# Patient Record
Sex: Female | Born: 1985
Health system: Southern US, Community
[De-identification: ages and names within clinical notes are randomized; demographics above are authoritative.]

## PROBLEM LIST (undated history)

## (undated) DIAGNOSIS — D241 Benign neoplasm of right breast: Secondary | ICD-10-CM

## (undated) DIAGNOSIS — K5900 Constipation, unspecified: Secondary | ICD-10-CM

## (undated) DIAGNOSIS — E039 Hypothyroidism, unspecified: Secondary | ICD-10-CM

## (undated) HISTORY — DX: Hypothyroidism, unspecified: E03.9

## (undated) HISTORY — DX: Benign neoplasm of right breast: D24.1

## (undated) HISTORY — DX: Constipation, unspecified: K59.00

---
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
## 2009-09-28 HISTORY — PX: WISDOM TOOTH EXTRACTION: SHX21

## 2012-01-24 ENCOUNTER — Emergency Department: Payer: Self-pay | Admitting: *Deleted

## 2012-04-22 ENCOUNTER — Emergency Department (HOSPITAL_COMMUNITY): Payer: BC Managed Care – PPO

## 2012-04-22 ENCOUNTER — Emergency Department (HOSPITAL_COMMUNITY)
Admission: EM | Admit: 2012-04-22 | Discharge: 2012-04-23 | Disposition: A | Discharge status: Home / Self Care | Payer: BC Managed Care – PPO | Attending: Emergency Medicine | Admitting: Emergency Medicine

## 2012-04-22 ENCOUNTER — Encounter (HOSPITAL_COMMUNITY): Payer: Self-pay | Admitting: Emergency Medicine

## 2012-04-22 DIAGNOSIS — W540XXA Bitten by dog, initial encounter: Secondary | ICD-10-CM | POA: Insufficient documentation

## 2012-04-22 DIAGNOSIS — S61409A Unspecified open wound of unspecified hand, initial encounter: Secondary | ICD-10-CM | POA: Insufficient documentation

## 2012-04-22 MED ORDER — ONDANSETRON 4 MG PO TBDP
4.0000 mg | ORAL_TABLET | Freq: Once | ORAL | Status: AC
  Administered 2012-04-22: 4 mg via ORAL
  Filled 2012-04-22: qty 1

## 2012-04-22 MED ORDER — OXYCODONE-ACETAMINOPHEN 5-325 MG PO TABS
1.0000 | ORAL_TABLET | Freq: Once | ORAL | Status: AC
  Administered 2012-04-22: 1 via ORAL
  Filled 2012-04-22: qty 1

## 2012-04-22 MED ORDER — OXYCODONE-ACETAMINOPHEN 5-325 MG PO TABS: 1.0000 | ORAL_TABLET | Freq: Four times a day (QID) | ORAL | Status: AC | PRN

## 2012-04-22 MED ORDER — ONDANSETRON HCL 4 MG PO TABS: 4.0000 mg | ORAL_TABLET | Freq: Four times a day (QID) | ORAL | Status: AC

## 2012-04-22 MED ORDER — AMOXICILLIN-POT CLAVULANATE 500-125 MG PO TABS: 1.0000 | ORAL_TABLET | Freq: Three times a day (TID) | ORAL | Status: AC

## 2012-04-22 MED ORDER — AMOXICILLIN-POT CLAVULANATE 875-125 MG PO TABS
1.0000 | ORAL_TABLET | Freq: Once | ORAL | Status: AC
  Administered 2012-04-22: 1 via ORAL
  Filled 2012-04-22: qty 1

## 2012-04-22 NOTE — ED Provider Notes (Signed)
History     CSN: [id number]  Arrival date & time 04/22/12  2113   First MD Initiated Contact with Patient 04/22/12 2218      Chief Complaint  Patient presents with  . Animal Bite    (Consider location/radiation/quality/duration/timing/severity/associated sxs/prior treatment) HPI  He should brought to the ER by her mom with complaints of laceration to the fourth finger on the right hand. Her 2 dogs got into a fight and she tried to break it out. She states that both of her shots are up-to-date on rabies vaccination and she had her last tetanus shot in 2011. She has sustained a fracture to her right fourth finger PIP joint. She denies any other injuries. She states that she can move her hands full range of motion and without much pain. The incident happened one hour ago prior to arrival. NAD/VSS  History reviewed. No pertinent past medical history.  History reviewed. No pertinent past surgical history.  No family history on file.  History  Substance Use Topics  . Smoking status: Not on file  . Smokeless tobacco: Not on file  . Alcohol Use: Not on file    OB History    Grav Para Term Preterm Abortions TAB SAB Ect Mult Living                  Review of Systems   HEENT: denies blurry vision or change in hearing PULMONARY: Denies difficulty breathing and SOB CARDIAC: denies chest pain or heart palpitations MUSCULOSKELETAL:  denies being unable to ambulate ABDOMEN AL: denies abdominal pain GU: denies loss of bowel or urinary control NEURO: denies numbness and tingling in extremities SKIN: no new rashes PSYCH: patient denies anxiety or depression. NECK: Pt denies having neck pain     Allergies  Review of patient's allergies indicates no known allergies.  Home Medications   Current Outpatient Rx  Name Route Sig Dispense Refill  . AMOXICILLIN-POT CLAVULANATE 500-125 MG PO TABS Oral Take 1 tablet (500 mg total) by mouth every 8 (eight) hours. 21 tablet 0  .  ONDANSETRON HCL 4 MG PO TABS Oral Take 1 tablet (4 mg total) by mouth every 6 (six) hours. 12 tablet 0  . OXYCODONE-ACETAMINOPHEN 5-325 MG PO TABS Oral Take 1 tablet by mouth every 6 (six) hours as needed for pain. 15 tablet 0    BP 95/50  Pulse 59  Temp 99 F (37.2 C) (Oral)  Resp 17  SpO2 100%  LMP 04/02/2012  Physical Exam  Nursing note and vitals reviewed. Constitutional: She appears well-developed and well-nourished. No distress.  HENT:  Head: Normocephalic and atraumatic.  Eyes: Pupils are equal, round, and reactive to light.  Neck: Normal range of motion. Neck supple.  Cardiovascular: Normal rate and regular rhythm.   Pulmonary/Chest: Effort normal.  Abdominal: Soft.  Musculoskeletal:       Right hand: She exhibits tenderness and laceration (2cm lac as described  in laceration repair). She exhibits normal range of motion, no bony tenderness, normal capillary refill, no deformity and no swelling. normal sensation noted. Normal strength noted.       Hands: Neurological: She is alert.  Skin: Skin is warm and dry.    ED Course  Procedures (including critical care time)  Labs Reviewed - No data to display Dg Hand Complete Right  04/22/2012  *RADIOLOGY REPORT*  Clinical Data: Animal bite  RIGHT HAND - COMPLETE 3+ VIEW  Comparison: None.  Findings: No acute fracture and no dislocation.  Unremarkable  soft tissues.  IMPRESSION: No acute bony pathology.  Original Report Authenticated By: Donavan Burnet, M.D.     1. Dog bite of hand       MDM   Patients wound is gapping, therefore two loose stitches applied. Pt  UTD on tetanus, both dogs have rabies vaccination up to date. Pt has not deficits in her hand, neuro vascularity intact.  Wound thoroughly irrigated with of water. Discussed risk of infection despite irrigation and antibiotics.  LACERATION REPAIR Performed by: Dorthula Matas Authorized by: Dorthula Matas Consent: Verbal consent obtained. Risks and  benefits: risks, benefits and alternatives were discussed Consent given by: patient Patient identity confirmed: provided demographic data Prepped and Draped in normal sterile fashion Wound explored  Laceration Location: right hand over 4th PIP joint  Laceration Length: 2cm  No Foreign Bodies seen or palpated  Anesthesia: local infiltration  Local anesthetic: lidocaine 2% wo epinephrine  Anesthetic total: 3 ml  Irrigation method: syringe Amount of cleaning: standard  Skin closure: sutures  Number of sutures: 2  Technique: simple interrupted loose  Patient tolerance: Patient tolerated the procedure well with no immediate complications.    Pt started on Augmenting and given hand referral.  Pt has been advised of the symptoms that warrant their return to the ED. Patient has voiced understanding and has agreed to follow-up with the PCP or specialist.        Dorthula Matas, PA 04/22/12 2355

## 2012-04-22 NOTE — ED Provider Notes (Signed)
Medical screening examination/treatment/procedure(s) were performed by non-physician practitioner and as supervising physician I was immediately available for consultation/collaboration.  Kameko Hukill, MD 04/22/12 2359 

## 2012-04-22 NOTE — ED Notes (Signed)
Pt has lac on 4th finger on R hand. Pt was trying to break up a dog fight. Pt states that dogs were up to date on their shots. Pt is up to date on tetanus shot. Bleeding controlled.

## 2013-06-22 IMAGING — CR DG HAND COMPLETE 3+V*R*
3 series · 3 of 3 positions shown · non-contrast
Comparison: None.

CLINICAL DATA: Animal bite

RIGHT HAND - COMPLETE 3+ VIEW

[x hand pa right]
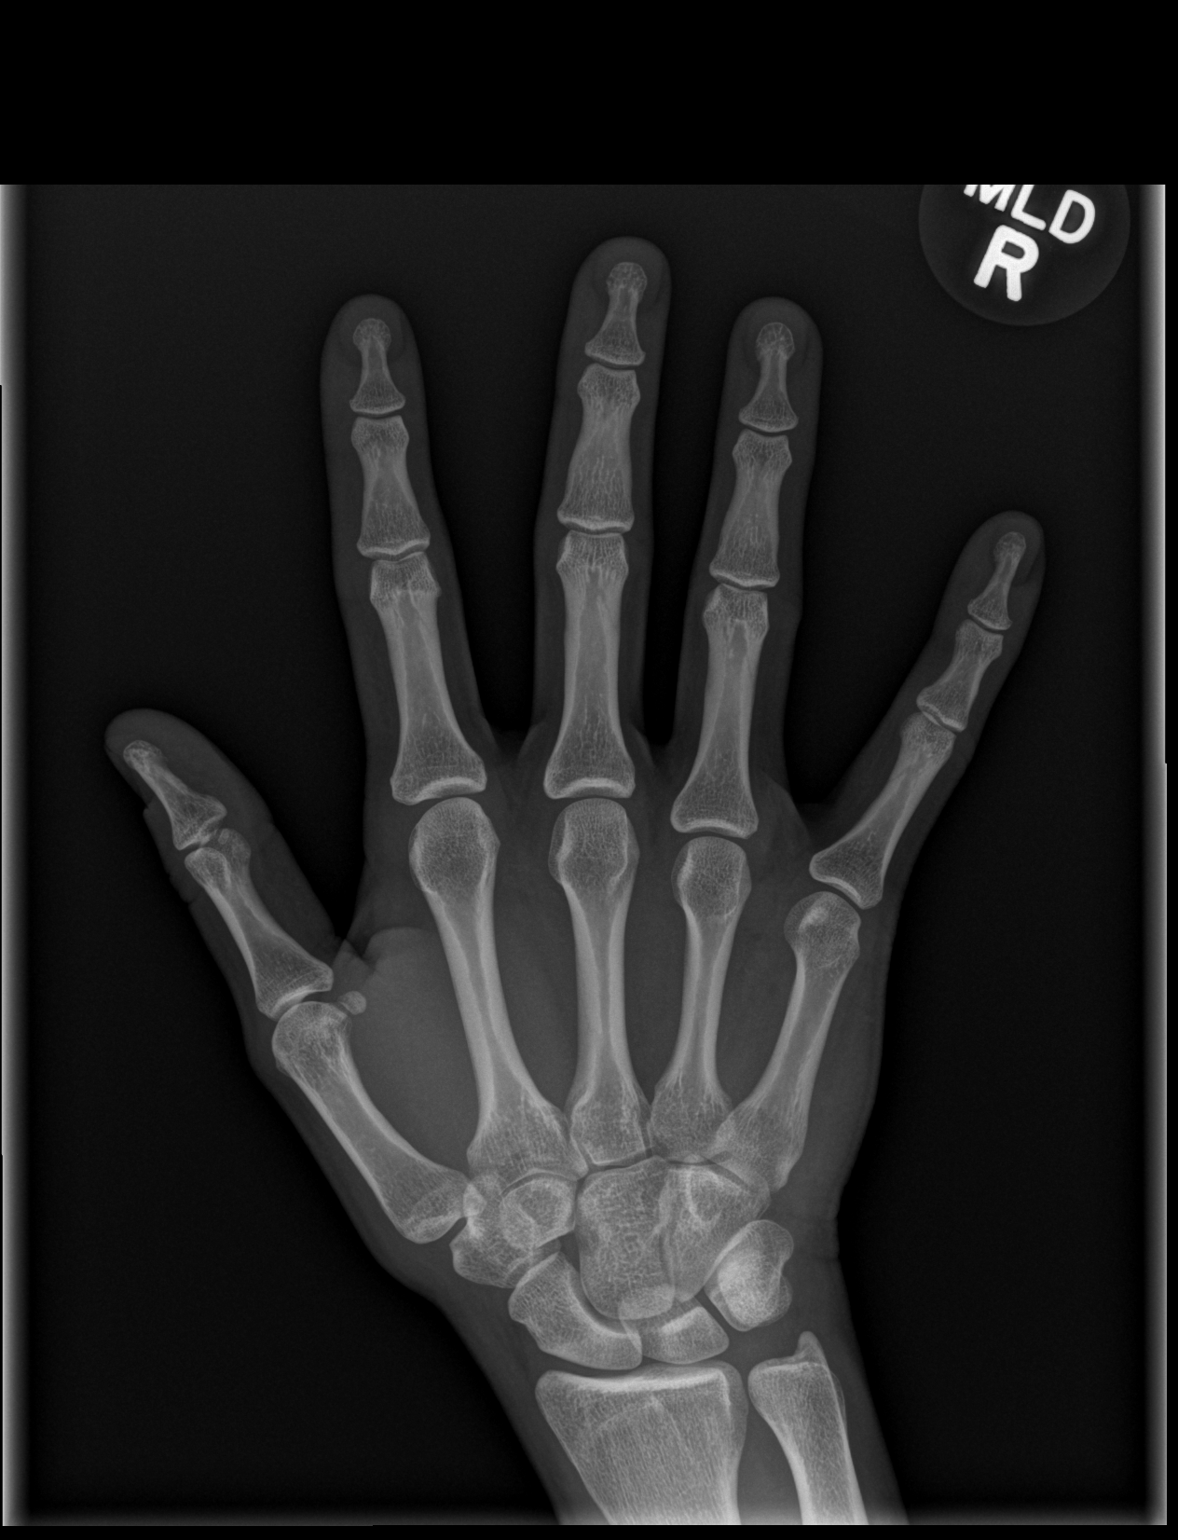

[x hand obl right]
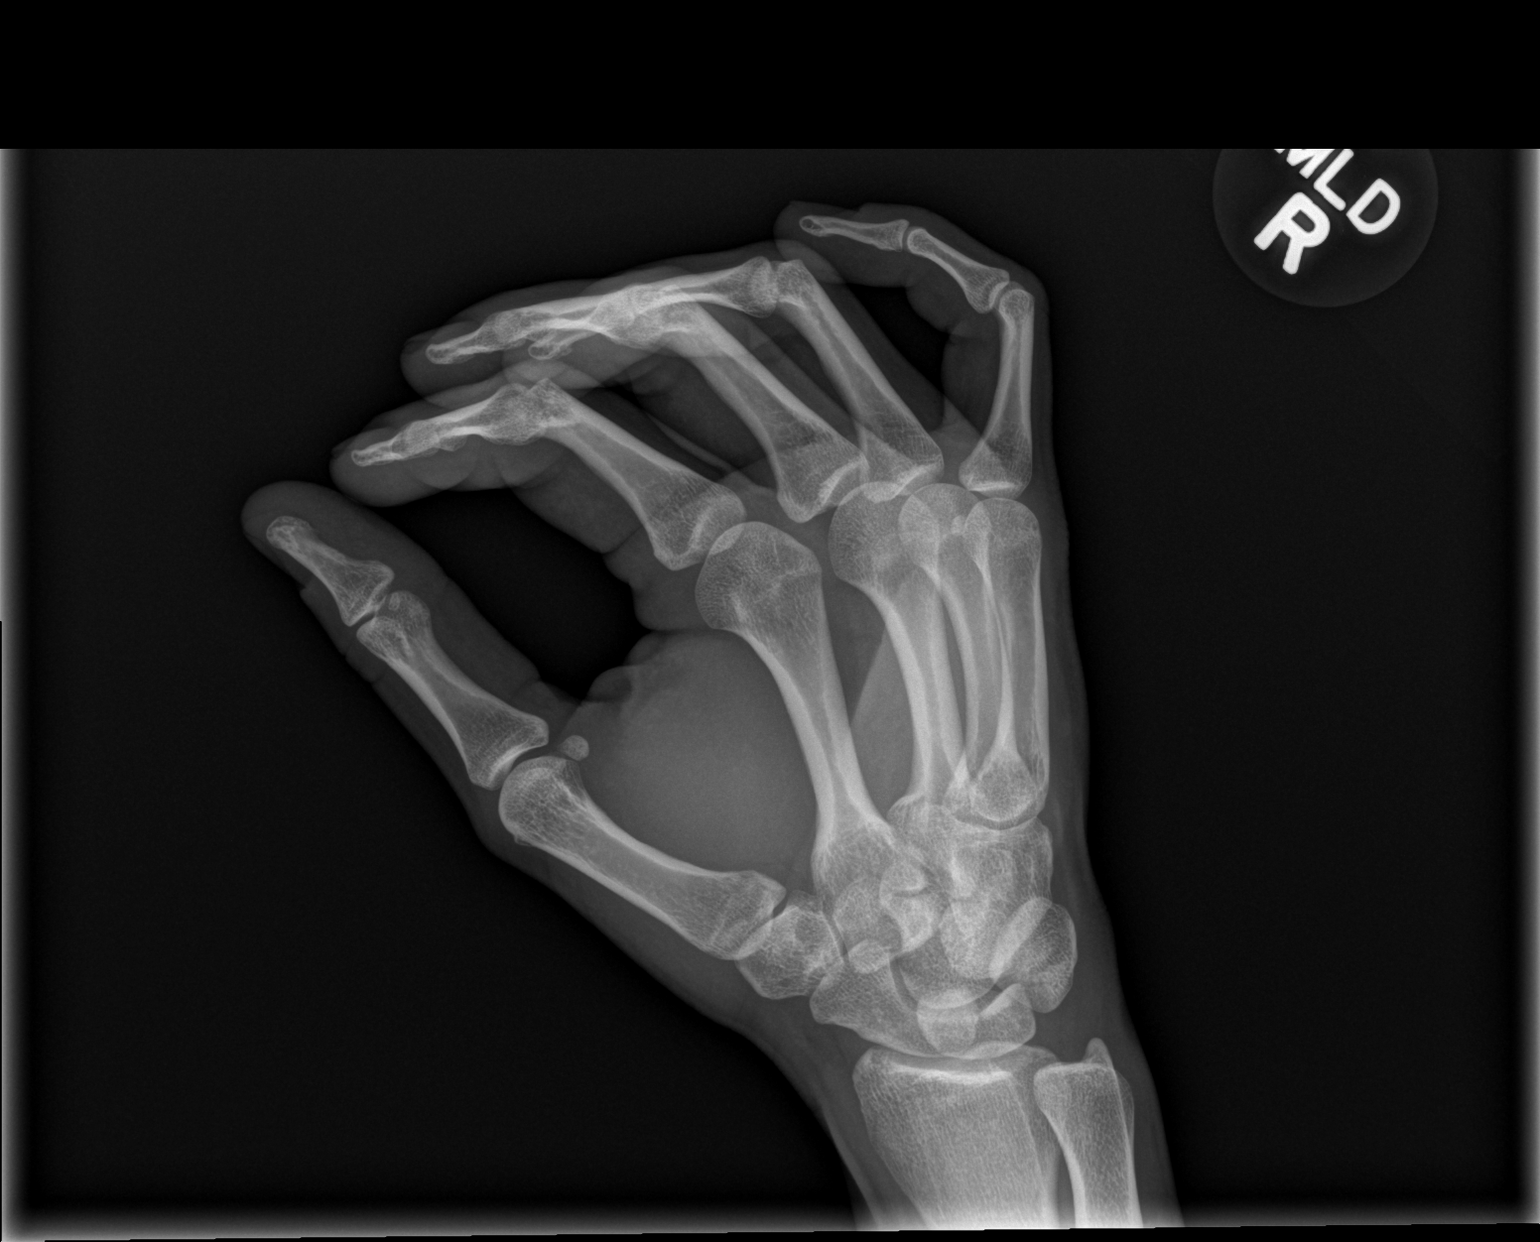

[x hand lat right]
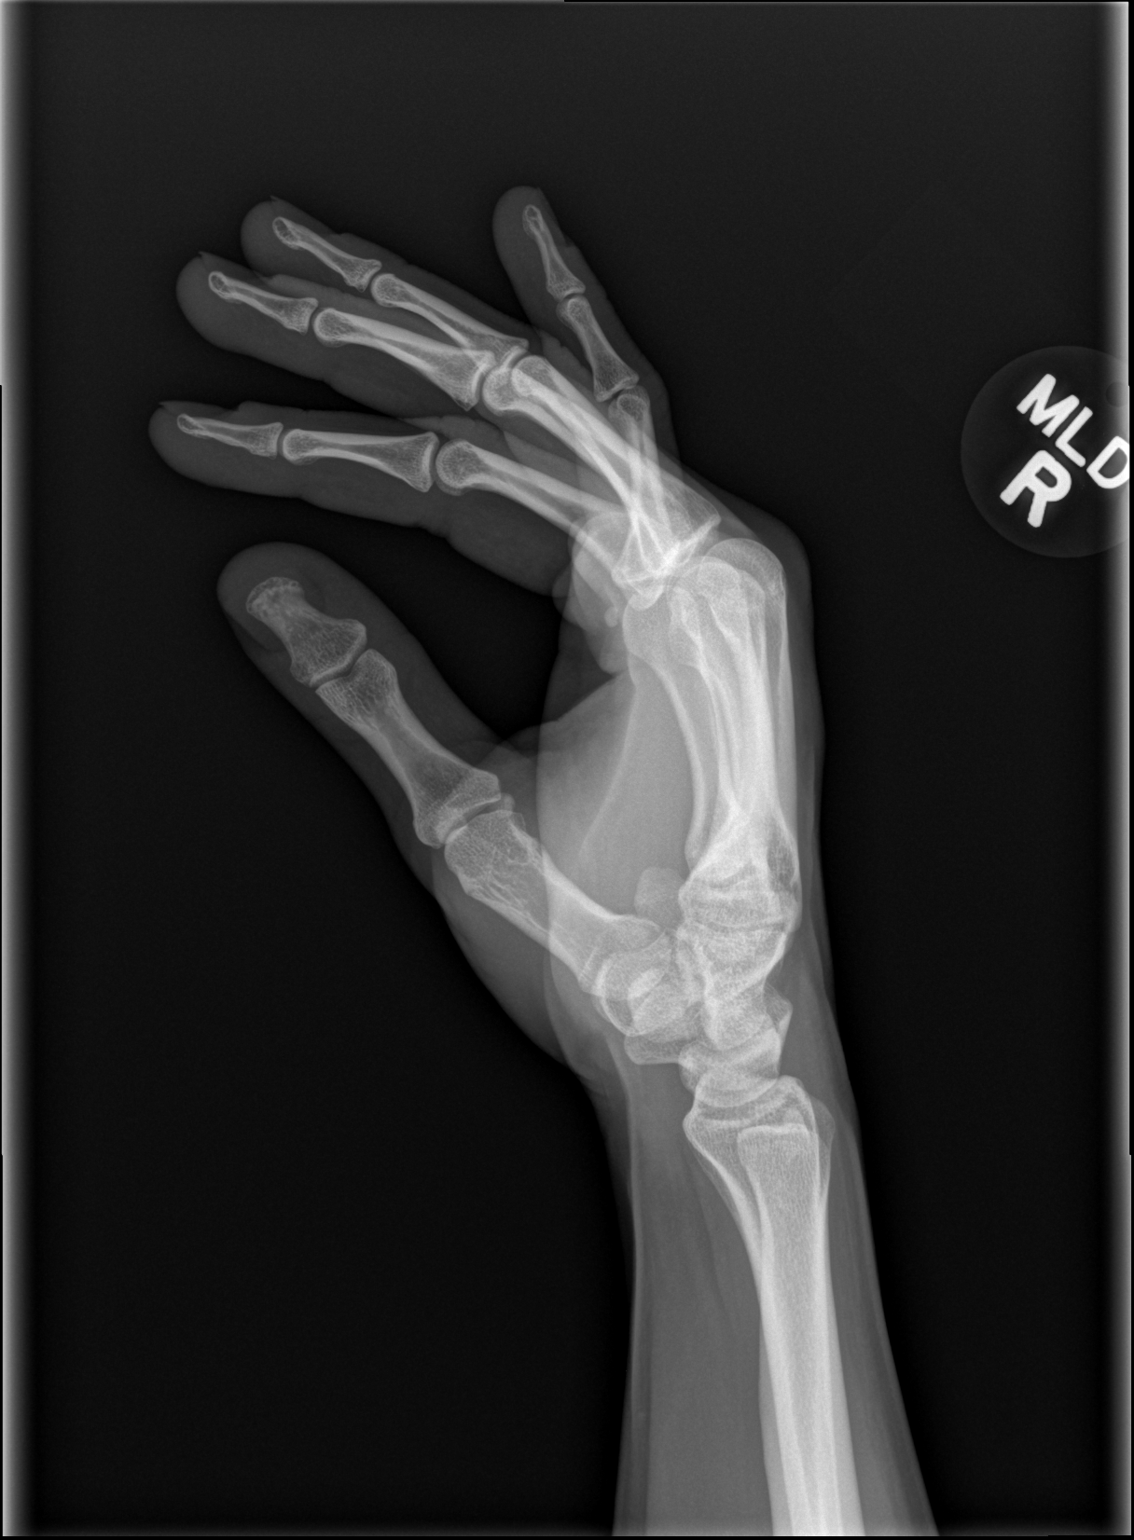

[3 of 3 positions shown; findings below may reference images not displayed]

FINDINGS: No acute fracture and no dislocation.  Unremarkable soft
tissues.
IMPRESSION: No acute bony pathology.

## 2014-03-13 ENCOUNTER — Ambulatory Visit: Payer: Self-pay | Admitting: Unknown Physician Specialty

## 2015-12-20 LAB — HM PAP SMEAR

## 2016-12-09 DIAGNOSIS — E039 Hypothyroidism, unspecified: Secondary | ICD-10-CM | POA: Diagnosis not present

## 2016-12-09 DIAGNOSIS — Z131 Encounter for screening for diabetes mellitus: Secondary | ICD-10-CM | POA: Diagnosis not present

## 2016-12-10 DIAGNOSIS — E039 Hypothyroidism, unspecified: Secondary | ICD-10-CM | POA: Diagnosis not present

## 2017-01-20 DIAGNOSIS — R300 Dysuria: Secondary | ICD-10-CM | POA: Diagnosis not present

## 2017-01-20 DIAGNOSIS — B373 Candidiasis of vulva and vagina: Secondary | ICD-10-CM | POA: Diagnosis not present

## 2017-10-01 DIAGNOSIS — K219 Gastro-esophageal reflux disease without esophagitis: Secondary | ICD-10-CM | POA: Diagnosis not present

## 2017-10-01 DIAGNOSIS — E039 Hypothyroidism, unspecified: Secondary | ICD-10-CM | POA: Diagnosis not present

## 2017-10-01 DIAGNOSIS — F411 Generalized anxiety disorder: Secondary | ICD-10-CM | POA: Diagnosis not present

## 2018-02-14 DIAGNOSIS — D2271 Melanocytic nevi of right lower limb, including hip: Secondary | ICD-10-CM | POA: Diagnosis not present

## 2018-02-14 DIAGNOSIS — D2272 Melanocytic nevi of left lower limb, including hip: Secondary | ICD-10-CM | POA: Diagnosis not present

## 2018-02-14 DIAGNOSIS — D2262 Melanocytic nevi of left upper limb, including shoulder: Secondary | ICD-10-CM | POA: Diagnosis not present

## 2018-02-14 DIAGNOSIS — D2261 Melanocytic nevi of right upper limb, including shoulder: Secondary | ICD-10-CM | POA: Diagnosis not present

## 2018-02-14 DIAGNOSIS — D485 Neoplasm of uncertain behavior of skin: Secondary | ICD-10-CM | POA: Diagnosis not present

## 2018-02-15 DIAGNOSIS — F411 Generalized anxiety disorder: Secondary | ICD-10-CM | POA: Diagnosis not present

## 2018-02-15 DIAGNOSIS — G4709 Other insomnia: Secondary | ICD-10-CM | POA: Diagnosis not present

## 2018-02-15 DIAGNOSIS — E039 Hypothyroidism, unspecified: Secondary | ICD-10-CM | POA: Diagnosis not present

## 2018-02-15 DIAGNOSIS — F988 Other specified behavioral and emotional disorders with onset usually occurring in childhood and adolescence: Secondary | ICD-10-CM | POA: Diagnosis not present

## 2018-05-18 DIAGNOSIS — E039 Hypothyroidism, unspecified: Secondary | ICD-10-CM | POA: Diagnosis not present

## 2018-05-18 DIAGNOSIS — R202 Paresthesia of skin: Secondary | ICD-10-CM | POA: Diagnosis not present

## 2018-05-18 DIAGNOSIS — F411 Generalized anxiety disorder: Secondary | ICD-10-CM | POA: Diagnosis not present

## 2018-06-16 DIAGNOSIS — D2272 Melanocytic nevi of left lower limb, including hip: Secondary | ICD-10-CM | POA: Diagnosis not present

## 2018-07-08 DIAGNOSIS — L237 Allergic contact dermatitis due to plants, except food: Secondary | ICD-10-CM | POA: Diagnosis not present

## 2018-07-08 DIAGNOSIS — R350 Frequency of micturition: Secondary | ICD-10-CM | POA: Diagnosis not present

## 2018-08-03 DIAGNOSIS — R202 Paresthesia of skin: Secondary | ICD-10-CM | POA: Diagnosis not present

## 2018-08-03 DIAGNOSIS — R399 Unspecified symptoms and signs involving the genitourinary system: Secondary | ICD-10-CM | POA: Diagnosis not present

## 2018-08-03 DIAGNOSIS — E039 Hypothyroidism, unspecified: Secondary | ICD-10-CM | POA: Diagnosis not present

## 2018-08-03 DIAGNOSIS — R350 Frequency of micturition: Secondary | ICD-10-CM | POA: Diagnosis not present

## 2018-08-04 DIAGNOSIS — N3000 Acute cystitis without hematuria: Secondary | ICD-10-CM | POA: Diagnosis not present

## 2018-08-11 DIAGNOSIS — N3000 Acute cystitis without hematuria: Secondary | ICD-10-CM | POA: Diagnosis not present

## 2018-10-17 ENCOUNTER — Encounter: Payer: Self-pay | Admitting: Obstetrics and Gynecology

## 2018-10-17 NOTE — Patient Instructions (Signed)
I value your feedback and entrusting us with your care. If you get a [facility] patient survey, I would appreciate you taking the time to let us know about your experience today. Thank you! 

## 2018-10-17 NOTE — Progress Notes (Signed)
PCP:  Patient, No Pcp Per   Chief Complaint  Patient presents with  . Gynecologic Exam     HPI:      [name] is a 33 y.o. No obstetric history on file. who LMP was Patient's last menstrual period was 10/02/2018 (approximate)., presents today for her annual examination.  Her menses are regular every 28-30 days, lasting 4 days.  Dysmenorrhea none. She does not have intermenstrual bleeding.  Sex activity: single partner, contraception - none. Conception ok. Taking vitamins.  Last Pap: December 20, 2015  Results were: no abnormalities  Hx of STDs: none  There is a FH of breast cancer in her PGGM, genetic testing not indicated. There is no FH of ovarian cancer. The patient does do self-breast exams. Hx of RT breast fibroadenoma in 2006, followed by Dr. Bary Castilla then. Pt has occas pain with fibroadenoma, usually before menses, but denies any change in size.   Tobacco use: The patient denies current or previous tobacco use. Alcohol use: social drinker Occas marijuana drug use.  Exercise: moderately active  She does get adequate calcium and Vitamin D in her diet. Labs with PCP, managing hypothyroidism.   Past Medical History:  Diagnosis Date  . Constipation   . Fibroadenoma of breast, right 2006   Dr. Bary Castilla  . Hypothyroidism     Past Surgical History:  Procedure Laterality Date  . WISDOM TOOTH EXTRACTION  2011    Family History  Problem Relation Age of Onset  . Hyperlipidemia Mother   . Other Mother   . Hypertension Father   . Hyperlipidemia Maternal Grandfather   . Testicular cancer Maternal Grandfather   . Prostate cancer Maternal Grandfather 60  . Other Other        thyroid issues  . Breast cancer Paternal Great-grandmother     Social History   Socioeconomic History  . Marital status: Single    Spouse name: Not on file  . Number of children: Not on file  . Years of education: Not on file  . Highest education level: Not on file  Occupational History    . Not on file  Social Needs  . Financial resource strain: Not on file  . Food insecurity:    Worry: Not on file    Inability: Not on file  . Transportation needs:    Medical: Not on file    Non-medical: Not on file  Tobacco Use  . Smoking status: Never Smoker  . Smokeless tobacco: Never Used  Substance and Sexual Activity  . Alcohol use: Yes  . Drug use: Yes    Types: Marijuana  . Sexual activity: Yes    Birth control/protection: None  Lifestyle  . Physical activity:    Days per week: Not on file    Minutes per session: Not on file  . Stress: Not on file  Relationships  . Social connections:    Talks on phone: Not on file    Gets together: Not on file    Attends religious service: Not on file    Active member of club or organization: Not on file    Attends meetings of clubs or organizations: Not on file    Relationship status: Not on file  . Intimate partner violence:    Fear of current or ex partner: Not on file    Emotionally abused: Not on file    Physically abused: Not on file    Forced sexual activity: Not on file  Other Topics  Concern  . Not on file  Social History Narrative  . Not on file    Outpatient Medications Prior to Visit  Medication Sig Dispense Refill  . levothyroxine (SYNTHROID, LEVOTHROID) 50 MCG tablet Take by mouth.     No facility-administered medications prior to visit.       ROS:  Review of Systems  Constitutional: Negative for fatigue, fever and unexpected weight change.  Respiratory: Negative for cough, shortness of breath and wheezing.   Cardiovascular: Negative for chest pain, palpitations and leg swelling.  Gastrointestinal: Negative for blood in stool, constipation, diarrhea, nausea and vomiting.  Endocrine: Negative for cold intolerance, heat intolerance and polyuria.  Genitourinary: Negative for dyspareunia, dysuria, flank pain, frequency, genital sores, hematuria, menstrual problem, pelvic pain, urgency, vaginal bleeding,  vaginal discharge and vaginal pain.  Musculoskeletal: Negative for back pain, joint swelling and myalgias.  Skin: Negative for rash.  Neurological: Negative for dizziness, syncope, light-headedness, numbness and headaches.  Hematological: Negative for adenopathy.  Psychiatric/Behavioral: Negative for agitation, confusion, sleep disturbance and suicidal ideas. The patient is not nervous/anxious.    BREAST: pos for tenderness/mass   Objective: BP 104/64   Pulse 92   Ht 5\' 5"  (1.651 m)   Wt 142 lb (64.4 kg)   LMP 10/02/2018 (Approximate)   BMI 23.63 kg/m    Physical Exam Constitutional:      Appearance: She is well-developed.  Genitourinary:     Vulva, vagina, cervix, uterus, right adnexa and left adnexa normal.     No vulval lesion or tenderness noted.     No vaginal discharge, erythema or tenderness.     No cervical polyp.     Uterus is not enlarged or tender.     No right or left adnexal mass present.     Right adnexa not tender.     Left adnexa not tender.  Neck:     Musculoskeletal: Normal range of motion.     Thyroid: No thyromegaly.  Cardiovascular:     Rate and Rhythm: Normal rate and regular rhythm.     Heart sounds: Normal heart sounds. No murmur.  Pulmonary:     Effort: Pulmonary effort is normal.     Breath sounds: Normal breath sounds.  Chest:     Breasts:        Right: Mass present. No nipple discharge, skin change or tenderness.        Left: No mass, nipple discharge, skin change or tenderness.       Comments: RT BREAST 9:30 POS WITH 1 CM FIRM, MOBILE, NT MASS Abdominal:     Palpations: Abdomen is soft.     Tenderness: There is no abdominal tenderness. There is no guarding.  Musculoskeletal: Normal range of motion.  Neurological:     Mental Status: She is alert and oriented to person, place, and time.     Cranial Nerves: No cranial nerve deficit.  Psychiatric:        Behavior: Behavior normal.  Vitals signs reviewed.      Assessment/Plan: Encounter for annual routine gynecological examination  Cervical cancer screening - Plan: Cytology - PAP  Screening for HPV (human papillomavirus) - Plan: Cytology - PAP  Fibroadenoma of breast, right - No change in size. Discussed repeat imaging if any change in size/pain/sx. Pt to f/u prn for now.          GYN counsel breast self exam, adequate intake of calcium and vitamin D, diet and exercise     F/U  Return  in about 1 year (around 10/19/2019).  Alicia B. Copland, PA-C 10/18/2018 2:51 PM

## 2018-10-18 ENCOUNTER — Other Ambulatory Visit (HOSPITAL_COMMUNITY)
Admission: RE | Admit: 2018-10-18 | Discharge: 2018-10-18 | Disposition: A | Discharge status: Home / Self Care | Payer: BLUE CROSS/BLUE SHIELD | Source: Ambulatory Visit | Attending: Obstetrics and Gynecology | Admitting: Obstetrics and Gynecology

## 2018-10-18 ENCOUNTER — Encounter: Payer: Self-pay | Admitting: Obstetrics and Gynecology

## 2018-10-18 ENCOUNTER — Ambulatory Visit (INDEPENDENT_AMBULATORY_CARE_PROVIDER_SITE_OTHER): Payer: BLUE CROSS/BLUE SHIELD | Admitting: Obstetrics and Gynecology

## 2018-10-18 VITALS — BP 104/64 | HR 92 | Ht 65.0 in | Wt 142.0 lb

## 2018-10-18 DIAGNOSIS — Z01419 Encounter for gynecological examination (general) (routine) without abnormal findings: Secondary | ICD-10-CM

## 2018-10-18 DIAGNOSIS — Z1151 Encounter for screening for human papillomavirus (HPV): Secondary | ICD-10-CM | POA: Diagnosis not present

## 2018-10-18 DIAGNOSIS — Z124 Encounter for screening for malignant neoplasm of cervix: Secondary | ICD-10-CM | POA: Insufficient documentation

## 2018-10-18 DIAGNOSIS — D241 Benign neoplasm of right breast: Secondary | ICD-10-CM

## 2018-10-21 LAB — CYTOLOGY - PAP
Diagnosis: NEGATIVE
HPV: NOT DETECTED

## 2018-10-26 DIAGNOSIS — M9903 Segmental and somatic dysfunction of lumbar region: Secondary | ICD-10-CM | POA: Diagnosis not present

## 2018-10-26 DIAGNOSIS — M9902 Segmental and somatic dysfunction of thoracic region: Secondary | ICD-10-CM | POA: Diagnosis not present

## 2018-10-26 DIAGNOSIS — M545 Low back pain: Secondary | ICD-10-CM | POA: Diagnosis not present

## 2018-10-26 DIAGNOSIS — M546 Pain in thoracic spine: Secondary | ICD-10-CM | POA: Diagnosis not present

## 2018-12-09 DIAGNOSIS — M7541 Impingement syndrome of right shoulder: Secondary | ICD-10-CM | POA: Diagnosis not present

## 2018-12-09 DIAGNOSIS — M25511 Pain in right shoulder: Secondary | ICD-10-CM | POA: Diagnosis not present

## 2018-12-16 DIAGNOSIS — E039 Hypothyroidism, unspecified: Secondary | ICD-10-CM | POA: Diagnosis not present

## 2018-12-26 DIAGNOSIS — M7551 Bursitis of right shoulder: Secondary | ICD-10-CM | POA: Diagnosis not present

## 2018-12-30 DIAGNOSIS — M7551 Bursitis of right shoulder: Secondary | ICD-10-CM | POA: Diagnosis not present

## 2018-12-30 DIAGNOSIS — M7541 Impingement syndrome of right shoulder: Secondary | ICD-10-CM | POA: Diagnosis not present

## 2018-12-30 DIAGNOSIS — M25512 Pain in left shoulder: Secondary | ICD-10-CM | POA: Diagnosis not present

## 2019-01-20 DIAGNOSIS — M7542 Impingement syndrome of left shoulder: Secondary | ICD-10-CM | POA: Diagnosis not present

## 2019-01-20 DIAGNOSIS — M7541 Impingement syndrome of right shoulder: Secondary | ICD-10-CM | POA: Diagnosis not present

## 2019-01-20 DIAGNOSIS — M25512 Pain in left shoulder: Secondary | ICD-10-CM | POA: Diagnosis not present

## 2019-01-20 DIAGNOSIS — M7551 Bursitis of right shoulder: Secondary | ICD-10-CM | POA: Diagnosis not present

## 2019-02-27 DIAGNOSIS — R1031 Right lower quadrant pain: Secondary | ICD-10-CM | POA: Diagnosis not present

## 2019-02-27 DIAGNOSIS — E039 Hypothyroidism, unspecified: Secondary | ICD-10-CM | POA: Diagnosis not present

## 2019-02-27 DIAGNOSIS — R350 Frequency of micturition: Secondary | ICD-10-CM | POA: Diagnosis not present

## 2021-09-09 ENCOUNTER — Ambulatory Visit (INDEPENDENT_AMBULATORY_CARE_PROVIDER_SITE_OTHER): Payer: Self-pay | Admitting: Obstetrics and Gynecology

## 2021-09-09 ENCOUNTER — Encounter: Payer: Self-pay | Admitting: Obstetrics and Gynecology

## 2021-09-09 ENCOUNTER — Other Ambulatory Visit: Payer: Self-pay

## 2021-09-09 VITALS — BP 90/70 | Ht 65.0 in | Wt 158.0 lb

## 2021-09-09 DIAGNOSIS — N6311 Unspecified lump in the right breast, upper outer quadrant: Secondary | ICD-10-CM | POA: Insufficient documentation

## 2021-09-09 DIAGNOSIS — N644 Mastodynia: Secondary | ICD-10-CM

## 2021-09-09 DIAGNOSIS — D241 Benign neoplasm of right breast: Secondary | ICD-10-CM | POA: Insufficient documentation

## 2021-09-09 NOTE — Progress Notes (Signed)
Patient, No Pcp Per (Inactive)   Chief Complaint  Patient presents with   Breast Exam    Lump in RB, irritated about twice a month, no change in size, tenderness in both breasts    HPI:      [name] is a 35 y.o. G0P0000 whose LMP was Patient's last menstrual period was 08/12/2021 (exact date)., presents today for RT breast pain/mass. Hx of RT breast fibroadenoma in 2006, followed by Dr. Bary Castilla then. Sx in same place, no change in size since first noted. Just more painful to pt now. Has dull pain 1-2 times a month (not necessarily menstrual related) with occas sharp pains. Would like it removed. Hasn't had recent f/u. FH breast cancer in her PGGM. Also with bilat breast tenderness at times without other masses. Does SBE. Drinks a lot of caffeine.  Current on pap, past due for annual, plans to sched.  Patient Active Problem List   Diagnosis Date Noted   Mass of upper outer quadrant of right breast 09/09/2021   Fibroadenoma of breast, right 09/09/2021   Past Surgical History:  Procedure Laterality Date   WISDOM TOOTH EXTRACTION  2011    Family History  Problem Relation Age of Onset   Hyperlipidemia Mother    Other Mother    Hypertension Father    Skin cancer Father    Skin cancer Sister    Hyperlipidemia Maternal Grandfather    Testicular cancer Maternal Grandfather    Prostate cancer Maternal Grandfather 37   Breast cancer Paternal Great-grandmother    Other Other        thyroid issues    Social History   Socioeconomic History   Marital status: Married    Spouse name: Not on file   Number of children: Not on file   Years of education: Not on file   Highest education level: Not on file  Occupational History   Not on file  Tobacco Use   Smoking status: Never   Smokeless tobacco: Never  Vaping Use   Vaping Use: Never used  Substance and Sexual Activity   Alcohol use: Yes   Drug use: Yes    Types: Marijuana   Sexual activity: Yes    Birth  control/protection: None  Other Topics Concern   Not on file  Social History Narrative   Not on file   Social Determinants of Health   Financial Resource Strain: Not on file  Food Insecurity: Not on file  Transportation Needs: Not on file  Physical Activity: Not on file  Stress: Not on file  Social Connections: Not on file  Intimate Partner Violence: Not on file    Outpatient Medications Prior to Visit  Medication Sig Dispense Refill   levothyroxine (SYNTHROID) 100 MCG tablet Take 100 mcg by mouth daily.     PARoxetine (PAXIL) 10 MG tablet Take by mouth.     SUMAtriptan (IMITREX) 50 MG tablet Take by mouth.     levothyroxine (SYNTHROID, LEVOTHROID) 50 MCG tablet Take by mouth.     No facility-administered medications prior to visit.      ROS:  Review of Systems  Constitutional:  Negative for fever.  Gastrointestinal:  Negative for blood in stool, constipation, diarrhea, nausea and vomiting.  Genitourinary:  Negative for dyspareunia, dysuria, flank pain, frequency, hematuria, urgency, vaginal bleeding, vaginal discharge and vaginal pain.  Musculoskeletal:  Negative for back pain.  Skin:  Negative for rash.  BREAST: No symptoms   OBJECTIVE:  Vitals:  BP 90/70    Ht 5\' 5"  (1.651 m)    Wt 158 lb (71.7 kg)    LMP 08/12/2021 (Exact Date)    BMI 26.29 kg/m   Physical Exam Vitals reviewed.  Pulmonary:     Effort: Pulmonary effort is normal.  Chest:  Breasts:    Breasts are symmetrical.     Right: Mass present. No inverted nipple, nipple discharge, skin change or tenderness.     Left: No inverted nipple, mass, nipple discharge, skin change or tenderness.    Musculoskeletal:        General: Normal range of motion.     Cervical back: Normal range of motion.  Skin:    General: Skin is warm and dry.  Neurological:     General: No focal deficit present.     Mental Status: She is alert and oriented to person, place, and time.     Cranial Nerves: No cranial nerve  deficit.  Psychiatric:        Mood and Affect: Mood normal.        Behavior: Behavior normal.        Thought Content: Thought content normal.        Judgment: Judgment normal.    Assessment/Plan: Fibroadenoma of breast, right - Plan: Ambulatory referral to General Surgery; refer back to Dr. Bary Castilla for mgmt. Will let him order dx mammo if he thinks necessary.   Breast tenderness--bilat with neg breast exam otherwise. D/C caffeine. F/u prn/at annual.     Return if symptoms worsen or fail to improve.  Maram Bently B. Dvid Pendry, PA-C 09/09/2021 9:32 AM

## 2021-10-02 ENCOUNTER — Other Ambulatory Visit: Payer: Self-pay

## 2021-10-02 ENCOUNTER — Ambulatory Visit: Payer: Self-pay | Admitting: Obstetrics and Gynecology

## 2021-10-29 ENCOUNTER — Ambulatory Visit: Payer: Self-pay | Admitting: Obstetrics and Gynecology

## 2021-10-29 NOTE — Progress Notes (Deleted)
PCP:  Patient, No Pcp Per (Inactive)   No chief complaint on file.    HPI:      [name] is a 36 y.o. No obstetric history on file. who LMP was No LMP recorded., presents today for her annual examination.  Her menses are regular every 28-30 days, lasting 4 days.  Dysmenorrhea none. She does not have intermenstrual bleeding.  Sex activity: single partner, contraception - none. Conception ok. Taking vitamins.  Last Pap: 10/18/18  Results were: no abnormalities /neg HPV DNA Hx of STDs: none  There is a FH of breast cancer in her PGGM, genetic testing not indicated. There is no FH of ovarian cancer. The patient does do self-breast exams. Hx of RT breast fibroadenoma in 2006, followed by Dr. Bary Castilla then. Pt has occas pain with fibroadenoma, usually before menses, but denies any change in size. Hx of RT breast fibroadenoma in 2006, followed by Dr. Bary Castilla then. Sx in same place, no change in size since first noted. Just more painful to pt now. Has dull pain 1-2 times a month (not necessarily menstrual related) with occas sharp pains. Would like it removed  Tobacco use: The patient denies current or previous tobacco use. Alcohol use: social drinker Occas marijuana drug use.  Exercise: moderately active  She does get adequate calcium and Vitamin D in her diet. Labs with PCP, managing hypothyroidism.   Past Medical History:  Diagnosis Date   Constipation    Fibroadenoma of breast, right 2006   Dr. Bary Castilla   Hypothyroidism     Past Surgical History:  Procedure Laterality Date   WISDOM TOOTH EXTRACTION  2011    Family History  Problem Relation Age of Onset   Hyperlipidemia Mother    Other Mother    Hypertension Father    Skin cancer Father    Skin cancer Sister    Hyperlipidemia Maternal Grandfather    Testicular cancer Maternal Grandfather    Prostate cancer Maternal Grandfather 61   Breast cancer Paternal Great-grandmother    Other Other        thyroid issues     Social History   Socioeconomic History   Marital status: Married    Spouse name: Not on file   Number of children: Not on file   Years of education: Not on file   Highest education level: Not on file  Occupational History   Not on file  Tobacco Use   Smoking status: Never   Smokeless tobacco: Never  Vaping Use   Vaping Use: Never used  Substance and Sexual Activity   Alcohol use: Yes   Drug use: Yes    Types: Marijuana   Sexual activity: Yes    Birth control/protection: None  Other Topics Concern   Not on file  Social History Narrative   Not on file   Social Determinants of Health   Financial Resource Strain: Not on file  Food Insecurity: Not on file  Transportation Needs: Not on file  Physical Activity: Not on file  Stress: Not on file  Social Connections: Not on file  Intimate Partner Violence: Not on file    Outpatient Medications Prior to Visit  Medication Sig Dispense Refill   levothyroxine (SYNTHROID) 100 MCG tablet Take 100 mcg by mouth daily.     PARoxetine (PAXIL) 10 MG tablet Take by mouth.     SUMAtriptan (IMITREX) 50 MG tablet Take by mouth.     No facility-administered medications prior to visit.  ROS:  Review of Systems  Constitutional:  Positive for fatigue. Negative for fever and unexpected weight change.  Respiratory:  Negative for cough, shortness of breath and wheezing.   Cardiovascular:  Negative for chest pain, palpitations and leg swelling.  Gastrointestinal:  Negative for blood in stool, constipation, diarrhea, nausea and vomiting.  Endocrine: Negative for cold intolerance, heat intolerance and polyuria.  Genitourinary:  Negative for dyspareunia, dysuria, flank pain, frequency, genital sores, hematuria, menstrual problem, pelvic pain, urgency, vaginal bleeding, vaginal discharge and vaginal pain.  Musculoskeletal:  Negative for back pain, joint swelling and myalgias.  Skin:  Negative for rash.  Neurological:  Negative for  dizziness, syncope, light-headedness, numbness and headaches.  Hematological:  Negative for adenopathy.  Psychiatric/Behavioral:  Negative for agitation, confusion, sleep disturbance and suicidal ideas. The patient is not nervous/anxious.   BREAST: pos for tenderness/mass   Objective: There were no vitals taken for this visit.   Physical Exam Constitutional:      Appearance: She is well-developed.  Genitourinary:     Vulva normal.     No vaginal discharge, erythema or tenderness.      Right Adnexa: not tender and no mass present.    Left Adnexa: not tender and no mass present.    No cervical polyp.     Uterus is not enlarged or tender.  Breasts:    Right: Mass present. No nipple discharge, skin change or tenderness.     Left: No mass, nipple discharge, skin change or tenderness.  Neck:     Thyroid: No thyromegaly.  Cardiovascular:     Rate and Rhythm: Normal rate and regular rhythm.     Heart sounds: Normal heart sounds. No murmur heard. Pulmonary:     Effort: Pulmonary effort is normal.     Breath sounds: Normal breath sounds.  Chest:       Comments: RT BREAST 9:30 POS WITH 1 CM FIRM, MOBILE, NT MASS Abdominal:     Palpations: Abdomen is soft.     Tenderness: There is no abdominal tenderness. There is no guarding.  Musculoskeletal:        General: Normal range of motion.     Cervical back: Normal range of motion.  Neurological:     Mental Status: She is alert and oriented to person, place, and time.     Cranial Nerves: No cranial nerve deficit.  Psychiatric:        Behavior: Behavior normal.  Vitals reviewed.    Assessment/Plan: No diagnosis found.          GYN counsel breast self exam, adequate intake of calcium and vitamin D, diet and exercise     F/U  No follow-ups on file.  Yandell Mcjunkins B. Ruqayyah Lute, PA-C 10/29/2021 11:19 AM

## 2022-08-03 ENCOUNTER — Other Ambulatory Visit: Payer: Self-pay | Admitting: Sports Medicine

## 2022-08-03 DIAGNOSIS — G8929 Other chronic pain: Secondary | ICD-10-CM

## 2022-08-03 DIAGNOSIS — M545 Low back pain, unspecified: Secondary | ICD-10-CM

## 2022-10-05 ENCOUNTER — Ambulatory Visit: Payer: BLUE CROSS/BLUE SHIELD | Admitting: Certified Nurse Midwife

## 2022-10-20 ENCOUNTER — Ambulatory Visit
Admission: RE | Admit: 2022-10-20 | Discharge: 2022-10-20 | Disposition: A | Discharge status: Home / Self Care | Payer: Commercial Managed Care - PPO | Source: Ambulatory Visit | Attending: Sports Medicine

## 2022-10-20 DIAGNOSIS — M545 Low back pain, unspecified: Secondary | ICD-10-CM

## 2022-10-20 DIAGNOSIS — G8929 Other chronic pain: Secondary | ICD-10-CM

## 2022-10-22 ENCOUNTER — Other Ambulatory Visit: Payer: Self-pay | Admitting: Sports Medicine

## 2022-10-22 DIAGNOSIS — M545 Low back pain, unspecified: Secondary | ICD-10-CM

## 2022-10-22 DIAGNOSIS — N1330 Unspecified hydronephrosis: Secondary | ICD-10-CM

## 2022-11-17 ENCOUNTER — Ambulatory Visit
Admission: RE | Admit: 2022-11-17 | Discharge: 2022-11-17 | Disposition: A | Discharge status: Home / Self Care | Payer: Commercial Managed Care - PPO | Source: Ambulatory Visit | Attending: Sports Medicine | Admitting: Sports Medicine

## 2022-11-17 DIAGNOSIS — N1330 Unspecified hydronephrosis: Secondary | ICD-10-CM

## 2022-11-17 DIAGNOSIS — G8929 Other chronic pain: Secondary | ICD-10-CM

## 2023-01-20 ENCOUNTER — Telehealth: Payer: Self-pay

## 2023-01-20 NOTE — Telephone Encounter (Signed)
Pt calling has appt c [facility] 5/2; is having sxs that are causing her stress and anxiety over getting her ovaries checked; is having a little bit of pain and discomfort in that area; is urinating way more than she should; pain c IC; wants to conceive.  [phone]  Offered pt nurse visit to ck urine; pt doesn't think it is a UTI b/c it's diff from the other UTIs she has had; she just has freq; no pain.  Pt opted to wait for appt c [facility]; adv all her concerns can be discussed at appt with [facility].

## 2023-01-26 NOTE — Progress Notes (Unsigned)
PCP:  Patient, No Pcp Per   No chief complaint on file.    HPI:      Michele Drake is a 37 y.o. No obstetric history on file. who LMP was No LMP recorded., presents today for her annual examination.  Her menses are regular every 28-30 days, lasting 4 days.  Dysmenorrhea none. She does not have intermenstrual bleeding.  Sex activity: single partner, contraception - none. Conception ok. Taking vitamins.  Last Pap: 10/18/18 Results were: no abnormalities  Hx of STDs: none  There is a FH of breast cancer in her PGGM, genetic testing not indicated. There is no FH of ovarian cancer. The patient does do self-breast exams. Hx of RT breast fibroadenoma in 2006, followed by Dr. Lemar Livings then. Pt has occas pain with fibroadenoma, usually before menses, but denies any change in size.   Tobacco use: The patient denies current or previous tobacco use. Alcohol use: social drinker Occas marijuana drug use.  Exercise: moderately active  She does get adequate calcium and Vitamin D in her diet. Labs with PCP, managing hypothyroidism.   Past Medical History:  Diagnosis Date   Constipation    Fibroadenoma of breast, right 2006   Dr. Lemar Livings   Hypothyroidism     Past Surgical History:  Procedure Laterality Date   WISDOM TOOTH EXTRACTION  2011    Family History  Problem Relation Age of Onset   Hyperlipidemia Mother    Other Mother    Hypertension Father    Skin cancer Father    Skin cancer Sister    Hyperlipidemia Maternal Grandfather    Testicular cancer Maternal Grandfather    Prostate cancer Maternal Grandfather 38   Breast cancer Paternal Great-grandmother    Other Other        thyroid issues    Social History   Socioeconomic History   Marital status: Married    Spouse name: Not on file   Number of children: Not on file   Years of education: Not on file   Highest education level: Not on file  Occupational History   Not on file  Tobacco Use   Smoking status: Never    Smokeless tobacco: Never  Vaping Use   Vaping Use: Never used  Substance and Sexual Activity   Alcohol use: Yes   Drug use: Yes    Types: Marijuana   Sexual activity: Yes    Birth control/protection: None  Other Topics Concern   Not on file  Social History Narrative   Not on file   Social Determinants of Health   Financial Resource Strain: Not on file  Food Insecurity: Not on file  Transportation Needs: Not on file  Physical Activity: Not on file  Stress: Not on file  Social Connections: Not on file  Intimate Partner Violence: Not on file    Outpatient Medications Prior to Visit  Medication Sig Dispense Refill   levothyroxine (SYNTHROID) 100 MCG tablet Take 100 mcg by mouth daily.     PARoxetine (PAXIL) 10 MG tablet Take by mouth.     SUMAtriptan (IMITREX) 50 MG tablet Take by mouth.     No facility-administered medications prior to visit.      ROS:  Review of Systems  Constitutional:  Positive for fatigue. Negative for fever and unexpected weight change.  Respiratory:  Negative for cough, shortness of breath and wheezing.   Cardiovascular:  Negative for chest pain, palpitations and leg swelling.  Gastrointestinal:  Negative for blood in  stool, constipation, diarrhea, nausea and vomiting.  Endocrine: Negative for cold intolerance, heat intolerance and polyuria.  Genitourinary:  Negative for dyspareunia, dysuria, flank pain, frequency, genital sores, hematuria, menstrual problem, pelvic pain, urgency, vaginal bleeding, vaginal discharge and vaginal pain.  Musculoskeletal:  Negative for back pain, joint swelling and myalgias.  Skin:  Negative for rash.  Neurological:  Negative for dizziness, syncope, light-headedness, numbness and headaches.  Hematological:  Negative for adenopathy.  Psychiatric/Behavioral:  Negative for agitation, confusion, sleep disturbance and suicidal ideas. The patient is not nervous/anxious.    BREAST: pos for  tenderness/mass   Objective: There were no vitals taken for this visit.   Physical Exam Constitutional:      Appearance: She is well-developed.  Genitourinary:     Vulva normal.     No vaginal discharge, erythema or tenderness.      Right Adnexa: not tender and no mass present.    Left Adnexa: not tender and no mass present.    No cervical polyp.     Uterus is not enlarged or tender.  Breasts:    Right: Mass present. No nipple discharge, skin change or tenderness.     Left: No mass, nipple discharge, skin change or tenderness.  Neck:     Thyroid: No thyromegaly.  Cardiovascular:     Rate and Rhythm: Normal rate and regular rhythm.     Heart sounds: Normal heart sounds. No murmur heard. Pulmonary:     Effort: Pulmonary effort is normal.     Breath sounds: Normal breath sounds.  Chest:       Comments: RT BREAST 9:30 POS WITH 1 CM FIRM, MOBILE, NT MASS Abdominal:     Palpations: Abdomen is soft.     Tenderness: There is no abdominal tenderness. There is no guarding.  Musculoskeletal:        General: Normal range of motion.     Cervical back: Normal range of motion.  Neurological:     Mental Status: She is alert and oriented to person, place, and time.     Cranial Nerves: No cranial nerve deficit.  Psychiatric:        Behavior: Behavior normal.  Vitals reviewed.     Assessment/Plan: No diagnosis found.          GYN counsel breast self exam, adequate intake of calcium and vitamin D, diet and exercise     F/U  No follow-ups on file.  Jeylin Woodmansee B. Tomi Grandpre, PA-C 01/26/2023 5:09 PM

## 2023-01-28 ENCOUNTER — Other Ambulatory Visit (HOSPITAL_COMMUNITY)
Admission: RE | Admit: 2023-01-28 | Discharge: 2023-01-28 | Disposition: A | Discharge status: Home / Self Care | Payer: Commercial Managed Care - PPO | Source: Ambulatory Visit | Attending: Obstetrics and Gynecology | Admitting: Obstetrics and Gynecology

## 2023-01-28 ENCOUNTER — Ambulatory Visit (INDEPENDENT_AMBULATORY_CARE_PROVIDER_SITE_OTHER): Payer: Commercial Managed Care - PPO | Admitting: Obstetrics and Gynecology

## 2023-01-28 ENCOUNTER — Encounter: Payer: Self-pay | Admitting: Obstetrics and Gynecology

## 2023-01-28 VITALS — BP 110/60 | Ht 65.0 in | Wt 149.0 lb

## 2023-01-28 DIAGNOSIS — R350 Frequency of micturition: Secondary | ICD-10-CM

## 2023-01-28 DIAGNOSIS — Z124 Encounter for screening for malignant neoplasm of cervix: Secondary | ICD-10-CM | POA: Insufficient documentation

## 2023-01-28 DIAGNOSIS — Z1151 Encounter for screening for human papillomavirus (HPV): Secondary | ICD-10-CM | POA: Diagnosis present

## 2023-01-28 DIAGNOSIS — Z01411 Encounter for gynecological examination (general) (routine) with abnormal findings: Secondary | ICD-10-CM

## 2023-01-28 DIAGNOSIS — D241 Benign neoplasm of right breast: Secondary | ICD-10-CM

## 2023-01-28 DIAGNOSIS — N941 Unspecified dyspareunia: Secondary | ICD-10-CM

## 2023-01-28 DIAGNOSIS — Z01419 Encounter for gynecological examination (general) (routine) without abnormal findings: Secondary | ICD-10-CM

## 2023-01-28 LAB — POCT URINALYSIS DIPSTICK
Bilirubin, UA: NEGATIVE
Blood, UA: NEGATIVE
Glucose, UA: NEGATIVE
Ketones, UA: NEGATIVE
Leukocytes, UA: NEGATIVE
Nitrite, UA: NEGATIVE
Protein, UA: NEGATIVE
Spec Grav, UA: 1.02 (ref 1.010–1.025)
pH, UA: 6 (ref 5.0–8.0)

## 2023-01-28 NOTE — Patient Instructions (Signed)
I value your feedback and you entrusting us with your care. If you get a  patient survey, I would appreciate you taking the time to let us know about your experience today. Thank you! ? ? ?

## 2023-01-30 LAB — URINE CULTURE: Organism ID, Bacteria: NO GROWTH

## 2023-02-01 LAB — CYTOLOGY - PAP
Comment: NEGATIVE
Diagnosis: NEGATIVE
High risk HPV: NEGATIVE

## 2023-02-18 ENCOUNTER — Ambulatory Visit: Payer: Commercial Managed Care - PPO | Admitting: Obstetrics and Gynecology

## 2023-09-29 NOTE — L&D Delivery Note (Signed)
 Vtx at +3 station and LOA.  Pt desires VE due to maternal exhaustion.  I discussed the R&B of VE including but not limited to injury to fetus but the potential benefit of expedited delivery.  She gives her informed consent and wishes to proceed. Difficult to keep seal due to fetal hair. On the 5th pull delivered viable female apgars 8,9 over intact perineum but bilateral sulcus tear.   Placenta delivered spontaneously intact with 3VC. Repair with 2-0 and 3-0 Chromic with good support and hemostasis noted and R/V exam confirms.   Mother and baby were doing well.  EBL 250cc  Alm Cook, MD

## 2023-10-27 ENCOUNTER — Telehealth: Payer: Self-pay

## 2023-10-27 ENCOUNTER — Other Ambulatory Visit: Payer: Self-pay

## 2023-10-27 MED ORDER — METOCLOPRAMIDE HCL 10 MG PO TABS: 10.0000 mg | ORAL_TABLET | Freq: Two times a day (BID) | ORAL | 1 refills | Status: DC | PRN

## 2023-10-27 NOTE — Telephone Encounter (Signed)
Advised of Noelle Penner, CNM response sent in my chart. He will pick up rx and access her my chart to view this information.

## 2023-10-27 NOTE — Telephone Encounter (Signed)
Left message to return call. Advised will send my chart message as well.

## 2023-10-27 NOTE — Telephone Encounter (Signed)
TRIAGE VOICEMAIL: Husband, Apolinar Junes, calling for patient. Advised to call him, not her. Patient recently found out she is pregnant. She has a h/o migraines. She is having excruciating migraine pain. She can't take the imitrex. She can't take excedrin migraine to relieve it. Inquiring if there is something else she take that is safe for her migraines.

## 2024-06-06 LAB — OB RESULTS CONSOLE GBS: GBS: NEGATIVE

## 2024-07-01 ENCOUNTER — Inpatient Hospital Stay (HOSPITAL_COMMUNITY)
Admission: AD | Admit: 2024-07-01 | Discharge: 2024-07-03 | DRG: 807 | Disposition: A | Discharge status: Home / Self Care | Attending: Obstetrics and Gynecology | Admitting: Obstetrics and Gynecology

## 2024-07-01 ENCOUNTER — Encounter (HOSPITAL_COMMUNITY): Payer: Self-pay | Admitting: Obstetrics and Gynecology

## 2024-07-01 ENCOUNTER — Inpatient Hospital Stay (HOSPITAL_COMMUNITY): Admitting: Anesthesiology

## 2024-07-01 ENCOUNTER — Other Ambulatory Visit: Payer: Self-pay

## 2024-07-01 ENCOUNTER — Inpatient Hospital Stay (EMERGENCY_DEPARTMENT_HOSPITAL)
Admission: AD | Admit: 2024-07-01 | Discharge: 2024-07-01 | Disposition: A | Discharge status: Home / Self Care | Source: Home / Self Care | Attending: Obstetrics and Gynecology | Admitting: Obstetrics and Gynecology

## 2024-07-01 DIAGNOSIS — O471 False labor at or after 37 completed weeks of gestation: Secondary | ICD-10-CM | POA: Diagnosis not present

## 2024-07-01 DIAGNOSIS — Z3689 Encounter for other specified antenatal screening: Secondary | ICD-10-CM

## 2024-07-01 DIAGNOSIS — Z23 Encounter for immunization: Secondary | ICD-10-CM | POA: Diagnosis not present

## 2024-07-01 DIAGNOSIS — O09523 Supervision of elderly multigravida, third trimester: Secondary | ICD-10-CM | POA: Insufficient documentation

## 2024-07-01 DIAGNOSIS — Z8249 Family history of ischemic heart disease and other diseases of the circulatory system: Secondary | ICD-10-CM | POA: Diagnosis not present

## 2024-07-01 DIAGNOSIS — Z3A39 39 weeks gestation of pregnancy: Secondary | ICD-10-CM | POA: Diagnosis not present

## 2024-07-01 DIAGNOSIS — O26893 Other specified pregnancy related conditions, third trimester: Secondary | ICD-10-CM | POA: Diagnosis present

## 2024-07-01 DIAGNOSIS — O368330 Maternal care for abnormalities of the fetal heart rate or rhythm, third trimester, not applicable or unspecified: Secondary | ICD-10-CM | POA: Insufficient documentation

## 2024-07-01 LAB — CBC
HCT: 35 % — ABNORMAL LOW (ref 36.0–46.0)
Hemoglobin: 11.7 g/dL — ABNORMAL LOW (ref 12.0–15.0)
MCH: 30.9 pg (ref 26.0–34.0)
MCHC: 33.4 g/dL (ref 30.0–36.0)
MCV: 92.3 fL (ref 80.0–100.0)
Platelets: 187 K/uL (ref 150–400)
RBC: 3.79 MIL/uL — ABNORMAL LOW (ref 3.87–5.11)
RDW: 13.2 % (ref 11.5–15.5)
WBC: 17.9 K/uL — ABNORMAL HIGH (ref 4.0–10.5)
nRBC: 0 % (ref 0.0–0.2)

## 2024-07-01 LAB — TYPE AND SCREEN
ABO/RH(D): O POS
Antibody Screen: NEGATIVE

## 2024-07-01 MED ORDER — FENTANYL-BUPIVACAINE-NACL 0.5-0.125-0.9 MG/250ML-% EP SOLN: 12.0000 mL/h | EPIDURAL | Status: DC | PRN

## 2024-07-01 MED ORDER — OXYCODONE-ACETAMINOPHEN 5-325 MG PO TABS: 2.0000 | ORAL_TABLET | ORAL | Status: DC | PRN

## 2024-07-01 MED ORDER — EPHEDRINE 5 MG/ML INJ: 10.0000 mg | INTRAVENOUS | Status: DC | PRN

## 2024-07-01 MED ORDER — OXYTOCIN BOLUS FROM INFUSION
333.0000 mL | Freq: Once | INTRAVENOUS | Status: AC
  Administered 2024-07-01: 333 mL via INTRAVENOUS

## 2024-07-01 MED ORDER — PHENYLEPHRINE 80 MCG/ML (10ML) SYRINGE FOR IV PUSH (FOR BLOOD PRESSURE SUPPORT)
80.0000 ug | PREFILLED_SYRINGE | INTRAVENOUS | Status: DC | PRN
  Filled 2024-07-01: qty 10

## 2024-07-01 MED ORDER — LACTATED RINGERS IV SOLN
500.0000 mL | Freq: Once | INTRAVENOUS | Status: AC
  Administered 2024-07-01: 500 mL via INTRAVENOUS

## 2024-07-01 MED ORDER — OXYCODONE-ACETAMINOPHEN 5-325 MG PO TABS: 1.0000 | ORAL_TABLET | ORAL | Status: DC | PRN

## 2024-07-01 MED ORDER — DIPHENHYDRAMINE HCL 50 MG/ML IJ SOLN: 12.5000 mg | INTRAMUSCULAR | Status: DC | PRN

## 2024-07-01 MED ORDER — OXYTOCIN-SODIUM CHLORIDE 30-0.9 UT/500ML-% IV SOLN
2.5000 [IU]/h | INTRAVENOUS | Status: DC
  Administered 2024-07-01: 2.5 [IU]/h via INTRAVENOUS
  Filled 2024-07-01: qty 500

## 2024-07-01 MED ORDER — ACETAMINOPHEN 325 MG PO TABS: 650.0000 mg | ORAL_TABLET | ORAL | Status: DC | PRN

## 2024-07-01 MED ORDER — ZOLPIDEM TARTRATE 5 MG PO TABS
5.0000 mg | ORAL_TABLET | Freq: Once | ORAL | Status: AC
  Administered 2024-07-01: 5 mg via ORAL
  Filled 2024-07-01: qty 1

## 2024-07-01 MED ORDER — ONDANSETRON HCL 4 MG/2ML IJ SOLN: 4.0000 mg | Freq: Four times a day (QID) | INTRAMUSCULAR | Status: DC | PRN

## 2024-07-01 MED ORDER — LACTATED RINGERS IV SOLN: INTRAVENOUS | Status: DC

## 2024-07-01 MED ORDER — PHENYLEPHRINE 80 MCG/ML (10ML) SYRINGE FOR IV PUSH (FOR BLOOD PRESSURE SUPPORT): 80.0000 ug | PREFILLED_SYRINGE | INTRAVENOUS | Status: DC | PRN

## 2024-07-01 MED ORDER — MORPHINE SULFATE (PF) 4 MG/ML IV SOLN
4.0000 mg | Freq: Once | INTRAVENOUS | Status: AC
  Administered 2024-07-01: 4 mg via INTRAMUSCULAR
  Filled 2024-07-01: qty 1

## 2024-07-01 MED ORDER — SOD CITRATE-CITRIC ACID 500-334 MG/5ML PO SOLN: 30.0000 mL | ORAL | Status: DC | PRN

## 2024-07-01 MED ORDER — TRANEXAMIC ACID-NACL 1000-0.7 MG/100ML-% IV SOLN
INTRAVENOUS | Status: AC
  Administered 2024-07-01: 1000 mg via INTRAVENOUS
  Filled 2024-07-01: qty 100

## 2024-07-01 MED ORDER — LIDOCAINE HCL (PF) 1 % IJ SOLN
INTRAMUSCULAR | Status: DC | PRN
  Administered 2024-07-01: 8 mL via EPIDURAL

## 2024-07-01 MED ORDER — EPHEDRINE 5 MG/ML INJ
10.0000 mg | INTRAVENOUS | Status: DC | PRN
Start: 2024-07-01 — End: 2024-07-02

## 2024-07-01 MED ORDER — ACETAMINOPHEN 500 MG PO TABS
1000.0000 mg | ORAL_TABLET | Freq: Four times a day (QID) | ORAL | Status: DC | PRN
  Administered 2024-07-01: 1000 mg via ORAL
  Filled 2024-07-01: qty 2

## 2024-07-01 MED ORDER — LIDOCAINE HCL (PF) 1 % IJ SOLN
30.0000 mL | INTRAMUSCULAR | Status: AC | PRN
  Administered 2024-07-01: 30 mL via SUBCUTANEOUS
  Filled 2024-07-01: qty 30

## 2024-07-01 MED ORDER — FENTANYL-BUPIVACAINE-NACL 0.5-0.125-0.9 MG/250ML-% EP SOLN
12.0000 mL/h | EPIDURAL | Status: DC | PRN
  Filled 2024-07-01: qty 250

## 2024-07-01 MED ORDER — LACTATED RINGERS IV SOLN: 500.0000 mL | INTRAVENOUS | Status: DC | PRN

## 2024-07-01 NOTE — MAU Provider Note (Signed)
 None    S: [name] is a 38 y.o. G1P0000 at [redacted]w[redacted]d  who presents to MAU today complaining contractions q 2 minutes since 0100. She endorses vaginal bleeding. She denies LOF. She reports normal fetal movement.    O: BP 111/75 (BP Location: Right Arm)   Pulse 76   Temp 98.2 F (36.8 C)   Resp 12   Ht 5' 5 (1.651 m)   Wt 81.9 kg   SpO2 100%   BMI 30.05 kg/m  GENERAL: Well-developed, well-nourished female in no acute distress.  HEAD: Normocephalic, atraumatic.  CHEST: Normal effort of breathing, regular heart rate ABDOMEN: Soft, nontender, gravid  Cervical exam:  Dilation: Closed Exam by:: J.Jamus Loving , CNM   Fetal Monitoring: Baseline: 145 Variability: Moderate Accelerations: Present 10x10, 15x15  Decelerations: Variable  Contractions: Q57min   A: SIUP at [redacted]w[redacted]d  False labor  P: -Informed of cervical exam that is not reflective of labor. -Reviewed anticipated labor progression, but will discharge to home in interim.  -Discussed and offered pain medication and patient agreeable.  -Will give morphine and ambien. -Await reactive NST.   Michele Drake, CNM 07/01/2024 5:30 AM  Reassessment (6:38 AM) -NST reactive. -Pain medication given. -Discharge to home.  Drake LITTIE Synthia MSN, CNM Advanced Practice Provider, [facility]

## 2024-07-01 NOTE — Anesthesia Procedure Notes (Signed)
 Epidural Patient location during procedure: OB Start time: 07/01/2024 12:11 PM End time: 07/01/2024 12:15 PM  Staffing Anesthesiologist: Mallory Manus, MD  Preanesthetic Checklist Completed: patient identified, IV checked, site marked, risks and benefits discussed, surgical consent, monitors and equipment checked, pre-op evaluation and timeout performed  Epidural Patient position: sitting Prep: DuraPrep and site prepped and draped Patient monitoring: continuous pulse ox and blood pressure Approach: midline Location: L4-L5 Injection technique: LOR air  Needle:  Needle type: Tuohy  Needle gauge: 17 G Needle length: 9 cm and 9 Needle insertion depth: 7 cm Catheter type: closed end flexible Catheter size: 19 Gauge Catheter at skin depth: 13 cm Test dose: negative  Assessment Events: blood not aspirated, no cerebrospinal fluid, injection not painful, no injection resistance, no paresthesia and negative IV test

## 2024-07-01 NOTE — MAU Provider Note (Signed)
 CYDNE GRAHN is a 38 y.o. G1P0000 female at [redacted]w[redacted]d she presents from the waiting room in a wheelchair complaining of very painful contractions every 2 to 3 minutes and leaking of fluid since 9 AM.  Patient requesting epidural  Labor check, seen by provider SVE :  4/80/-3 with confirmed ROM ( Clear Fluid- visualized during her exam) NST: FHR baseline 140 bpm, Variability: moderate, Accelerations:not present, Decelerations:  Absent= Cat 1/non reactive  Toco: regular, every 2 minutes    Spoke with Dr Marget [facility] Private Attending) Admission to L/D  Basic LD orders placed and patient may have epidural upon request  Olam DELENA Dalton , WHNP-BC 07/01/2024 11:31 AM

## 2024-07-01 NOTE — Anesthesia Preprocedure Evaluation (Signed)
 Anesthesia Evaluation  Patient identified by MRN, date of birth, ID band Patient awake    Reviewed: Allergy & Precautions, H&P , NPO status , Patient's Chart, lab work & pertinent test results, reviewed documented beta blocker date and time   Airway Mallampati: II  TM Distance: >3 FB Neck ROM: full    Dental no notable dental hx. (+) Teeth Intact, Dental Advisory Given   Pulmonary neg pulmonary ROS   Pulmonary exam normal breath sounds clear to auscultation       Cardiovascular negative cardio ROS Normal cardiovascular exam Rhythm:regular Rate:Normal     Neuro/Psych negative neurological ROS  negative psych ROS   GI/Hepatic negative GI ROS, Neg liver ROS,,,  Endo/Other  Hypothyroidism    Renal/GU negative Renal ROS  negative genitourinary   Musculoskeletal   Abdominal   Peds  Hematology negative hematology ROS (+)   Anesthesia Other Findings   Reproductive/Obstetrics (+) Pregnancy                              Anesthesia Physical Anesthesia Plan  ASA: 2  Anesthesia Plan: Epidural   Post-op Pain Management: Epidural*   Induction:   PONV Risk Score and Plan: 2 and Treatment may vary due to age or medical condition  Airway Management Planned: Natural Airway and Simple Face Mask  Additional Equipment: Fetal Monitoring  Intra-op Plan:   Post-operative Plan:   Informed Consent: I have reviewed the patients History and Physical, chart, labs and discussed the procedure including the risks, benefits and alternatives for the proposed anesthesia with the patient or authorized representative who has indicated his/her understanding and acceptance.     Dental Advisory Given  Plan Discussed with: Anesthesiologist and CRNA  Anesthesia Plan Comments: (Labs checked- platelets confirmed with RN in room. Fetal heart tracing, per RN, reported to be stable enough for sitting  procedure. Discussed epidural, and patient consents to the procedure:  included risk of possible headache,backache, failed block, allergic reaction, and nerve injury. This patient was asked if she had any questions or concerns before the procedure started.)        Anesthesia Quick Evaluation

## 2024-07-01 NOTE — MAU Note (Signed)
 Pt says UC's strong since 0130. Pain - 7/10 VE on Monday 1 cm Denies HSV GBS- neg

## 2024-07-01 NOTE — H&P (Signed)
 Michele Drake is a 38 y.o. female presenting for active labor and srom clear fluid.  Pregnancy uncomplicated and GBS negative. OB History     Gravida  1   Para  0   Term  0   Preterm  0   AB  0   Living  0      SAB  0   IAB  0   Ectopic  0   Multiple  0   Live Births  0          Past Medical History:  Diagnosis Date   Constipation    Fibroadenoma of breast, right 2006   Dr. Dessa   Hypothyroidism    Past Surgical History:  Procedure Laterality Date   WISDOM TOOTH EXTRACTION  2011   Family History: family history includes Breast cancer in her paternal great-grandmother; Hyperlipidemia in her maternal grandfather and mother; Hypertension in her father; Other in her mother and another family member; Prostate cancer (age of onset: 33) in her maternal grandfather; Skin cancer in her father and sister; Testicular cancer in her maternal grandfather. Social History:  reports that she has never smoked. She has never used smokeless tobacco. She reports that she does not currently use alcohol. She reports that she does not currently use drugs.     Maternal Diabetes: No Genetic Screening: Normal Maternal Ultrasounds/Referrals: Normal Fetal Ultrasounds or other Referrals:  None Maternal Substance Abuse:  No Significant Maternal Medications:  None Significant Maternal Lab Results:  Group B Strep negative Number of Prenatal Visits:greater than 3 verified prenatal visits Maternal Vaccinations:TDap Other Comments:  None  Review of Systems History Dilation: 9 Effacement (%): 80 Station: 0, Plus 1 Exam by:: Rosina Pizza, RN Blood pressure (!) 93/47, pulse 80, temperature (!) 100.9 F (38.3 C), temperature source Axillary, resp. rate 20, height 5' 5 (1.651 m), weight 81.9 kg, SpO2 98%. Exam Physical Exam  Vitals and nursing note reviewed. Exam conducted with a chaperone present.  Constitutional:      Appearance: Normal appearance.  HENT:     Head: Normocephalic.   Eyes:     Pupils: Pupils are equal, round, and reactive to light.  Cardiovascular:     Rate and Rhythm: Normal rate and regular rhythm.     Pulses: Normal pulses.  Abdominal:     General: Abdomen is Gravid, nontender Neurological:     Mental Status: She is alert. Pt wishes Full resuscitation in the event of a code. Prenatal labs: ABO, Rh: --/--/O POS (10/04 1128) Antibody: NEG (10/04 1128) Rubella:   RPR:    HBsAg:    HIV:    GBS: Negative/-- (09/09 0000)   Assessment/Plan: IUP at term Active labor anticipate svd Epidural in place The current Temp was axillary and her oral temp was 98 degrees at that time Will watch closely for sxs of chorio and will monitor oral temp    Alm JAYSON Cook 07/01/2024, 7:44 PM

## 2024-07-02 ENCOUNTER — Encounter (HOSPITAL_COMMUNITY): Payer: Self-pay | Admitting: Obstetrics and Gynecology

## 2024-07-02 LAB — CBC
HCT: 27 % — ABNORMAL LOW (ref 36.0–46.0)
Hemoglobin: 9.2 g/dL — ABNORMAL LOW (ref 12.0–15.0)
MCH: 31.2 pg (ref 26.0–34.0)
MCHC: 34.1 g/dL (ref 30.0–36.0)
MCV: 91.5 fL (ref 80.0–100.0)
Platelets: 162 K/uL (ref 150–400)
RBC: 2.95 MIL/uL — ABNORMAL LOW (ref 3.87–5.11)
RDW: 13.4 % (ref 11.5–15.5)
WBC: 22.3 K/uL — ABNORMAL HIGH (ref 4.0–10.5)
nRBC: 0 % (ref 0.0–0.2)

## 2024-07-02 LAB — RPR: RPR Ser Ql: NONREACTIVE

## 2024-07-02 MED ORDER — WITCH HAZEL-GLYCERIN EX PADS: 1.0000 | MEDICATED_PAD | CUTANEOUS | Status: DC | PRN

## 2024-07-02 MED ORDER — ESCITALOPRAM OXALATE 10 MG PO TABS
5.0000 mg | ORAL_TABLET | Freq: Every day | ORAL | Status: DC
  Filled 2024-07-02: qty 1

## 2024-07-02 MED ORDER — ONDANSETRON HCL 4 MG/2ML IJ SOLN: 4.0000 mg | INTRAMUSCULAR | Status: DC | PRN

## 2024-07-02 MED ORDER — OXYCODONE-ACETAMINOPHEN 5-325 MG PO TABS
1.0000 | ORAL_TABLET | ORAL | Status: DC | PRN
  Administered 2024-07-02: 1 via ORAL
  Filled 2024-07-02: qty 1

## 2024-07-02 MED ORDER — MEDROXYPROGESTERONE ACETATE 150 MG/ML IM SUSP: 150.0000 mg | INTRAMUSCULAR | Status: DC | PRN

## 2024-07-02 MED ORDER — DIBUCAINE (PERIANAL) 1 % EX OINT: 1.0000 | TOPICAL_OINTMENT | CUTANEOUS | Status: DC | PRN

## 2024-07-02 MED ORDER — ACETAMINOPHEN 325 MG PO TABS
650.0000 mg | ORAL_TABLET | ORAL | Status: DC | PRN
  Administered 2024-07-03: 650 mg via ORAL
  Filled 2024-07-02: qty 2

## 2024-07-02 MED ORDER — ONDANSETRON HCL 4 MG PO TABS: 4.0000 mg | ORAL_TABLET | ORAL | Status: DC | PRN

## 2024-07-02 MED ORDER — OXYCODONE-ACETAMINOPHEN 5-325 MG PO TABS: 2.0000 | ORAL_TABLET | ORAL | Status: DC | PRN

## 2024-07-02 MED ORDER — COCONUT OIL OIL
1.0000 | TOPICAL_OIL | Status: DC | PRN
  Administered 2024-07-02: 1 via TOPICAL

## 2024-07-02 MED ORDER — PRENATAL MULTIVITAMIN CH
1.0000 | ORAL_TABLET | Freq: Every day | ORAL | Status: DC
  Administered 2024-07-02 – 2024-07-03 (×2): 1 via ORAL
  Filled 2024-07-02 (×2): qty 1

## 2024-07-02 MED ORDER — SIMETHICONE 80 MG PO CHEW: 80.0000 mg | CHEWABLE_TABLET | ORAL | Status: DC | PRN

## 2024-07-02 MED ORDER — INFLUENZA VIRUS VACC SPLIT PF (FLUZONE) 0.5 ML IM SUSY
0.5000 mL | PREFILLED_SYRINGE | INTRAMUSCULAR | Status: AC
  Administered 2024-07-03: 0.5 mL via INTRAMUSCULAR
  Filled 2024-07-02: qty 0.5

## 2024-07-02 MED ORDER — TETANUS-DIPHTH-ACELL PERTUSSIS 5-2-15.5 LF-MCG/0.5 IM SUSP
0.5000 mL | Freq: Once | INTRAMUSCULAR | Status: DC
Start: 2024-07-02 — End: 2024-07-03

## 2024-07-02 MED ORDER — DIPHENHYDRAMINE HCL 25 MG PO CAPS: 25.0000 mg | ORAL_CAPSULE | Freq: Four times a day (QID) | ORAL | Status: DC | PRN

## 2024-07-02 MED ORDER — BENZOCAINE-MENTHOL 20-0.5 % EX AERO: 1.0000 | INHALATION_SPRAY | CUTANEOUS | Status: DC | PRN

## 2024-07-02 MED ORDER — MEASLES, MUMPS & RUBELLA VAC ~~LOC~~ SUSR
0.5000 mL | Freq: Once | SUBCUTANEOUS | Status: DC
Start: 2024-07-02 — End: 2024-07-03

## 2024-07-02 MED ORDER — SENNOSIDES-DOCUSATE SODIUM 8.6-50 MG PO TABS
2.0000 | ORAL_TABLET | Freq: Every day | ORAL | Status: DC
  Administered 2024-07-02 – 2024-07-03 (×2): 2 via ORAL
  Filled 2024-07-02 (×2): qty 2

## 2024-07-02 MED ORDER — LEVOTHYROXINE SODIUM 112 MCG PO TABS
112.0000 ug | ORAL_TABLET | Freq: Every day | ORAL | Status: DC
  Administered 2024-07-02 – 2024-07-03 (×2): 112 ug via ORAL
  Filled 2024-07-02 (×2): qty 1

## 2024-07-02 MED ORDER — IBUPROFEN 600 MG PO TABS
600.0000 mg | ORAL_TABLET | Freq: Four times a day (QID) | ORAL | Status: DC
  Administered 2024-07-02 – 2024-07-03 (×7): 600 mg via ORAL
  Filled 2024-07-02 (×7): qty 1

## 2024-07-02 MED ORDER — ZOLPIDEM TARTRATE 5 MG PO TABS: 5.0000 mg | ORAL_TABLET | Freq: Every evening | ORAL | Status: DC | PRN

## 2024-07-02 NOTE — Lactation Note (Signed)
 This note was copied from a baby's chart. Lactation Consultation Note  Patient Name: Michele Drake Unijb'd Date: 07/02/2024 Age:36 hours   P1- LC attempted to consult with MOB, but she was in a consult with her RN who stated that it was not a good time. LC team will attempt to see MOB for a consult at a later time.   Recardo Hoit BS, IBCLC 07/02/2024, 2:13 AM

## 2024-07-02 NOTE — Anesthesia Postprocedure Evaluation (Signed)
 Anesthesia Post Note  Patient: Michele Drake  Procedure(s) Performed: AN AD HOC LABOR EPIDURAL     Patient location during evaluation: Mother Baby Anesthesia Type: Epidural Level of consciousness: awake and alert Pain management: pain level controlled Vital Signs Assessment: post-procedure vital signs reviewed and stable Respiratory status: spontaneous breathing, nonlabored ventilation and respiratory function stable Cardiovascular status: stable Postop Assessment: no headache, no backache, epidural receding, no apparent nausea or vomiting, patient able to bend at knees, able to ambulate and adequate PO intake Anesthetic complications: no   No notable events documented.  Last Vitals:  Vitals:   07/02/24 0115 07/02/24 0530  BP: 109/82 105/64  Pulse: 75 81  Resp: 16 16  Temp: 36.9 C 36.7 C  SpO2: 97% 99%    Last Pain:  Vitals:   07/02/24 0530  TempSrc: Oral  PainSc:    Pain Goal:                   Amonda Brillhart Hristova

## 2024-07-02 NOTE — Progress Notes (Signed)
 CSW acknowledges consult placed due to history of depression and anxiety. CSW attempted to meet with MOB at bedside; however, when CSW entered the room, MOB had visitors present. MOB requested CSW return at a later time. CSW to follow up.   Signed,  Sharyne LOIS Roulette, MSW, LCSWA, LCASA 07/02/2024 4:14 PM

## 2024-07-02 NOTE — Lactation Note (Signed)
 This note was copied from a baby's chart. Lactation Consultation Note  Patient Name: Michele Drake Unijb'd Date: 07/02/2024 Age:36 hours Reason for consult: Initial assessment;1st time breastfeeding;Term;Maternal endocrine disorder  C/O: MOB has mild abrasions on both breast due infant previously having  shallow latches.  P1, term female infant, LC gave MOB pillows for breastfeeding support, infant latched with depth, swallows observed, MOB was still breastfeeding after 20 minutes when LC left the room. MOB has balm for sore nipples and knows if she feels pain or pinching to break infant 's latch and re-latch infant to the breast. MOB will continue to breastfeed infant by cues, on demand, 8-12 times within 24 hours, skin to skin. LC discussed the importance of maternal rest, meals and hydration. MOB knows to call for further latch assistance if needed. MOB was made aware of O/P services, breastfeeding support groups, community resources, and our phone # for post-discharge questions.    Maternal Data Has patient been taught Hand Expression?: Yes Does the patient have breastfeeding experience prior to this delivery?: No  Feeding Mother's Current Feeding Choice: Breast Milk  LATCH Score Latch: Grasps breast easily, tongue down, lips flanged, rhythmical sucking.  Audible Swallowing: Spontaneous and intermittent  Type of Nipple: Everted at rest and after stimulation  Comfort (Breast/Nipple): Filling, red/small blisters or bruises, mild/mod discomfort (abraisons on both breast due shallow latches previously)  Hold (Positioning): Assistance needed to correctly position infant at breast and maintain latch.  LATCH Score: 8   Lactation Tools Discussed/Used    Interventions Interventions: Breast feeding basics reviewed;Assisted with latch;Skin to skin;Breast compression;Adjust position;Support pillows;Position options;Education;DEBP;LC Services brochure;LPT handout/interventions;CDC milk  storage guidelines;CDC Guidelines for Breast Pump Cleaning  Discharge Pump: DEBP;Personal (MOB has Spectra  DEBP)  Consult Status Consult Status: Follow-up Date: 07/03/24 Follow-up type: In-patient    Grayce LULLA Batter 07/02/2024, 8:45 AM

## 2024-07-02 NOTE — Progress Notes (Signed)
 Post Partum Day 1 Subjective: no complaints, up ad lib, voiding, and tolerating PO  Objective: Blood pressure 95/61, pulse 72, temperature 98.2 F (36.8 C), resp. rate 18, height 5' 5 (1.651 m), weight 81.9 kg, SpO2 98%, unknown if currently breastfeeding.  Physical Exam:  General: alert, cooperative, appears stated age, and no distress Lochia: appropriate Uterine Fundus: firm Incision:   DVT Evaluation: No evidence of DVT seen on physical exam.  Recent Labs    07/01/24 1132 07/02/24 0507  HGB 11.7* 9.2*  HCT 35.0* 27.0*    Assessment/Plan: Plan for discharge tomorrow and Breastfeeding   LOS: 1 day   Alm JAYSON Cook, MD 07/02/2024, 10:21 AM

## 2024-07-03 MED ORDER — IBUPROFEN 600 MG PO TABS: 600.0000 mg | ORAL_TABLET | Freq: Four times a day (QID) | ORAL | 0 refills | Status: AC

## 2024-07-03 MED ORDER — ESCITALOPRAM OXALATE 5 MG PO TABS: 5.0000 mg | ORAL_TABLET | Freq: Every day | ORAL | 1 refills | Status: AC

## 2024-07-03 NOTE — Lactation Note (Signed)
 This note was copied from a baby's chart. Lactation Consultation Note  Patient Name: Michele Drake Unijb'd Date: 07/03/2024 Age:36 hours Reason for consult: Follow-up assessment;1st time breastfeeding;Nipple pain/trauma;Breastfeeding assistance  P1, Mother has abrasion on tip of R nipple. Assisted with latching in football hold on the L breast. Waited until baby opened wide and guided baby deep on breast to achieve the most comfort, lips flanged. Observed intermittent swallows and mother states it is comfortable. For soreness suggest mother apply ebm or coconut oil while wearing shells and alternate with comfort gels. Discussed if not latching on the right breast, pump for 15 min. Reviewed engorgement care and monitoring voids/stools. Discussed frequency of breastfeeding and volume expectations.  Maternal Data Has patient been taught Hand Expression?: Yes  Feeding Mother's Current Feeding Choice: Breast Milk  LATCH Score Latch: Grasps breast easily, tongue down, lips flanged, rhythmical sucking.  Audible Swallowing: A few with stimulation  Type of Nipple: Everted at rest and after stimulation  Comfort (Breast/Nipple): Filling, red/small blisters or bruises, mild/mod discomfort  Hold (Positioning): Assistance needed to correctly position infant at breast and maintain latch.  LATCH Score: 7   Lactation Tools Discussed/Used Tools: Shells;Coconut oil;Comfort gels  Interventions Interventions: Breast feeding basics reviewed;Assisted with latch;Hand express;Support pillows;Education  Discharge Discharge Education: Engorgement and breast care;Warning signs for feeding baby Pump: Personal;DEBP  Consult Status Consult Status: Complete Date: 07/03/24    Shannon Dines [facility] 07/03/2024, 2:02 PM

## 2024-07-03 NOTE — Discharge Summary (Signed)
 Postpartum Discharge Summary  Patient Name: Michele Drake DOB: November 29, 1985 MRN: [id number]  Date of admission: 07/01/2024 Delivery date:07/01/2024 Delivering provider: MARGET LENIS Date of discharge: 07/03/2024  Admitting diagnosis: Normal labor [O80, Z37.9] Intrauterine pregnancy: [redacted]w[redacted]d     Secondary diagnosis:  Principal Problem:   Normal labor  Additional problems: none    Discharge diagnosis: Term Pregnancy Delivered                                              Post partum procedures:none Augmentation: N/A Complications: None  Hospital course: Onset of Labor With Vaginal Delivery      38 y.o. yo G1P1001 at [redacted]w[redacted]d was admitted in Active Labor on 07/01/2024. Labor course was complicated by n/a  Membrane Rupture Time/Date: 9:00 AM,07/01/2024  Delivery Method:Vaginal, Vacuum (Extractor) Operative Delivery:Device used:vacuum Indication: Maternal exhaustion Episiotomy: None Lacerations:  Sulcus Patient had a postpartum course complicated by  n/a.  She is ambulating, tolerating a regular diet, passing flatus, and urinating well. Patient is discharged home in stable condition on 07/03/24.  Newborn Data: Birth date:07/01/2024 Birth time:10:25 PM Gender:Female Living status:Living Apgars:8 ,9  Weight:3685 g  Magnesium Sulfate received: No BMZ received: No Rhophylac:No MMR:No T-DaP:Given prenatally Flu: No RSV Vaccine received: No Transfusion:No  Immunizations received: There is no immunization history for the selected administration types on file for this patient.  Physical exam  Vitals:   07/02/24 1319 07/02/24 2023 07/02/24 2341 07/03/24 0421  BP: (!) 98/59 101/75 108/67 97/70  Pulse: 82 86 69 82  Resp: 16 16 18 16   Temp: 98.1 F (36.7 C) 99 F (37.2 C)  98.3 F (36.8 C)  TempSrc: Oral Oral  Oral  SpO2: 98% 98%  98%  Weight:      Height:       General: alert, cooperative, and no distress Lochia: appropriate Uterine Fundus: firm Incision: N/A DVT Evaluation:  No evidence of DVT seen on physical exam. Negative Homan's sign. No cords or calf tenderness. Labs: Lab Results  Component Value Date   WBC 22.3 (H) 07/02/2024   HGB 9.2 (L) 07/02/2024   HCT 27.0 (L) 07/02/2024   MCV 91.5 07/02/2024   PLT 162 07/02/2024       No data to display         Edinburgh Score:    07/03/2024    7:19 AM  Edinburgh Postnatal Depression Scale Screening Tool  I have been able to laugh and see the funny side of things. 0  I have looked forward with enjoyment to things. 0  I have blamed myself unnecessarily when things went wrong. 0  I have been anxious or worried for no good reason. 2  I have felt scared or panicky for no good reason. 1  Things have been getting on top of me. 1  I have been so unhappy that I have had difficulty sleeping. 0  I have felt sad or miserable. 1  I have been so unhappy that I have been crying. 1  The thought of harming myself has occurred to me. 0  Edinburgh Postnatal Depression Scale Total 6   Edinburgh Postnatal Depression Scale Total: 6   After visit meds:  Allergies as of 07/03/2024   No Known Allergies      Medication List     STOP taking these medications    metoCLOPramide  10 MG  tablet Commonly known as: REGLAN        TAKE these medications    escitalopram 5 MG tablet Commonly known as: LEXAPRO Take 1 tablet (5 mg total) by mouth daily. What changed:  how much to take when to take this   ibuprofen 600 MG tablet Commonly known as: ADVIL Take 1 tablet (600 mg total) by mouth every 6 (six) hours.   levothyroxine 112 MCG tablet Commonly known as: SYNTHROID Take 112 mcg by mouth daily.         Discharge home in stable condition Infant Feeding: Breast Infant Disposition:home with mother Discharge instruction: per After Visit Summary and Postpartum booklet. Activity: Advance as tolerated. Pelvic rest for 6 weeks.  Diet: routine diet Future Appointments:No future appointments. Follow up  Visit: 6 weeks PPV  07/03/2024 Duwaine Blumenthal, DO

## 2024-07-03 NOTE — Discharge Instructions (Signed)
 Call MD for T>100.4, heavy vaginal bleeding, severe abdominal pain, or respiratory distress.  Call office to schedule postpartum visit in 6 weeks.  Pelvic rest x 6 weeks.

## 2024-07-03 NOTE — Progress Notes (Signed)
 CSW received a consult due to hx of anxiety/depression and met MOB at bedside to complete a mental health assessment. CSW entered the room, introduced herself and acknowledged that FOB was present. MOB gave CSW verbal permission to speak about anything while FOB was present. CSW explained her role and the reason for the visit. MOB presented bonding/holding the infant as they lay in the bed.  MOB was polite, easy to engage, receptive to meeting with CSW, and appeared forthcoming.  CSW congratulated MOB and FOB on the arrival of the new infant. MOB said thank you; and reported delivery was rough, however she is excited for motherhood. CSW inquired about MOB's mental health history. MOB reported having a hx of anxiety/depression and describes them as moments in life. MOB reported being prescribed medication in the past; however she has never exceeded taking the medication longer than 3-4 months due to the side effects. MOB reported open to the idea of medication during pregnancy if symptoms of PPD occur. CSW lent her condolences to [facility] for the passing of her mother and encouraged MOB to reach out to her PCP/OB if PPD symptoms began.  MOB reported using coping techniques which included remaining organized and confiding in FOB for support. CSW asked MOB would therapy resources be helpful during this time PP period; MOB and FOB said yes. FOB reported they are currently not in therapy due to insurance; however they plan to change during open enrollment. CSW provided MOB with [place] resources in [place]. MOB reported her supports as FOB and her sister. CSW provided education regarding the baby blues period vs. perinatal mood disorders, discussed treatment and gave resources for mental health follow up if concerns arise.  CSW recommends self-evaluation during the postpartum time period using the New Mom Checklist from Postpartum Progress and encouraged MOB to contact a medical professional if symptoms  are noted at any time. CSW assessed for safety with MOB SI and HI; MOB denied all. CSW did not assess for DV; FOB was present.  CSW asked MOB has she selected a pediatrician for the infant's follow up visits; MOB said [employer]. MOB reported having all essential items for the infant including a carseat, bassinet and crib for safe sleeping. CSW provided review of Sudden Infant Death Syndrome (SIDS) precautions.  CSW identifies no further need for intervention and no barriers to discharge at this time.  Rosina Molt, [place] Clinical Social Worker [phone]

## 2024-07-03 NOTE — Progress Notes (Signed)
 Post Partum Day 2 Subjective: no complaints, up ad lib, voiding, and tolerating PO  Objective: Blood pressure 97/70, pulse 82, temperature 98.3 F (36.8 C), temperature source Oral, resp. rate 16, height 5' 5 (1.651 m), weight 81.9 kg, SpO2 98%, unknown if currently breastfeeding.  Physical Exam:  General: alert, cooperative, and appears stated age Lochia: appropriate Uterine Fundus: firm Incision: healing well, no significant drainage DVT Evaluation: No evidence of DVT seen on physical exam. Negative Homan's sign. No cords or calf tenderness.  Recent Labs    07/01/24 1132 07/02/24 0507  HGB 11.7* 9.2*  HCT 35.0* 27.0*    Assessment/Plan: Discharge home   LOS: 2 days   Duwaine Blumenthal, DO 07/03/2024, 9:45 AM

## 2024-07-11 ENCOUNTER — Inpatient Hospital Stay (HOSPITAL_COMMUNITY)

## 2024-07-11 ENCOUNTER — Inpatient Hospital Stay (HOSPITAL_COMMUNITY): Admission: AD | Admit: 2024-07-11 | Source: Home / Self Care | Admitting: Obstetrics and Gynecology

## 2024-07-11 ENCOUNTER — Telehealth (HOSPITAL_COMMUNITY): Payer: Self-pay | Admitting: *Deleted

## 2024-07-11 NOTE — Telephone Encounter (Signed)
 Attempted hospital discharge follow-up call. Left message for patient to return RN call with any questions or concerns. Allean IVAR Carton, RN, 07/11/24, [phone]
